# Patient Record
Sex: Female | Born: 1985 | Hispanic: Yes | Marital: Single | Smoking: Former smoker
Health system: Southern US, Community
[De-identification: ages and names within clinical notes are randomized; demographics above are authoritative.]

## PROBLEM LIST (undated history)

## (undated) DIAGNOSIS — F419 Anxiety disorder, unspecified: Secondary | ICD-10-CM

## (undated) DIAGNOSIS — F32A Depression, unspecified: Secondary | ICD-10-CM

## (undated) DIAGNOSIS — O24419 Gestational diabetes mellitus in pregnancy, unspecified control: Secondary | ICD-10-CM

## (undated) HISTORY — PX: NO PAST SURGERIES: SHX2092

## (undated) HISTORY — DX: Gestational diabetes mellitus in pregnancy, unspecified control: O24.419

---

## 2013-10-11 DIAGNOSIS — F32A Depression, unspecified: Secondary | ICD-10-CM | POA: Insufficient documentation

## 2014-06-20 DIAGNOSIS — Z8632 Personal history of gestational diabetes: Secondary | ICD-10-CM | POA: Insufficient documentation

## 2017-02-11 ENCOUNTER — Encounter (HOSPITAL_COMMUNITY): Payer: Self-pay | Admitting: Emergency Medicine

## 2017-02-11 ENCOUNTER — Emergency Department (HOSPITAL_COMMUNITY)
Admission: EM | Admit: 2017-02-11 | Discharge: 2017-02-11 | Disposition: A | Discharge status: Home / Self Care | Payer: Medicaid Other | Attending: Emergency Medicine | Admitting: Emergency Medicine

## 2017-02-11 DIAGNOSIS — F17200 Nicotine dependence, unspecified, uncomplicated: Secondary | ICD-10-CM | POA: Diagnosis not present

## 2017-02-11 DIAGNOSIS — H10023 Other mucopurulent conjunctivitis, bilateral: Secondary | ICD-10-CM | POA: Insufficient documentation

## 2017-02-11 DIAGNOSIS — H109 Unspecified conjunctivitis: Secondary | ICD-10-CM | POA: Diagnosis present

## 2017-02-11 DIAGNOSIS — H1033 Unspecified acute conjunctivitis, bilateral: Secondary | ICD-10-CM

## 2017-02-11 MED ORDER — ERYTHROMYCIN 5 MG/GM OP OINT
1.0000 "application " | TOPICAL_OINTMENT | Freq: Once | OPHTHALMIC | Status: AC
  Administered 2017-02-11: 1 via OPHTHALMIC
  Filled 2017-02-11: qty 3.5

## 2017-02-11 MED ORDER — DIPHENHYDRAMINE HCL 25 MG PO TABS: 25.0000 mg | ORAL_TABLET | Freq: Four times a day (QID) | ORAL | 0 refills | Status: DC

## 2017-02-11 NOTE — ED Notes (Signed)
Pt demonstrated appropriate application of eye ointment.

## 2017-02-11 NOTE — ED Triage Notes (Signed)
C/o L eye redness and burning x 3 days.  States symptoms are now starting in R eye.  Husband here with same symptoms.

## 2017-02-11 NOTE — Discharge Instructions (Signed)
Use the eye ointment 3 times a day in each eye for the next 7 days. Follow up with the eye doctor if symptoms are not improving over the next 24 hours or return here as needed.

## 2017-02-11 NOTE — ED Provider Notes (Signed)
[facility] Provider Note   CSN: [id number] Arrival date & time: 02/11/17  0006     History   Chief Complaint Chief Complaint  Patient presents with  . Conjunctivitis    HPI Alexis Caldwell is a 31 y.o. female who presents to the ED with eye irritation. She reports that her husband had the same thing that started the day before hers. She first noted irritation of the left eye and then it spread to the right. She complains of itching and redness.  HPI  History reviewed. No pertinent past medical history.  There are no active problems to display for this patient.   History reviewed. No pertinent surgical history.  OB History    No data available       Home Medications    Prior to Admission medications   Medication Sig Start Date End Date Taking? Authorizing Provider  diphenhydrAMINE (BENADRYL) 25 MG tablet Take 1 tablet (25 mg total) by mouth every 6 (six) hours. 02/11/17   Janne NapoleonNeese, Hope M, NP    Family History No family history on file.  Social History Social History  Substance Use Topics  . Smoking status: Current Every Day Smoker  . Smokeless tobacco: Never Used  . Alcohol use Yes     Allergies   Patient has no allergy information on record.   Review of Systems Review of Systems  Constitutional: Negative for chills and fever.  HENT: Negative.   Eyes: Positive for photophobia, discharge, redness and itching. Negative for visual disturbance.  Respiratory: Negative for cough.   Cardiovascular: Negative for chest pain.  Gastrointestinal: Negative for nausea and vomiting.  Skin: Negative for rash.  Neurological: Negative for dizziness and headaches.  Hematological: Negative for adenopathy.  Psychiatric/Behavioral: The patient is not nervous/anxious.      Physical Exam Updated Vital Signs BP 121/71 (BP Location: Right Arm)   Pulse 89   Temp 98.4 F (36.9 C) (Oral)   Resp 20   LMP 01/28/2017   SpO2 99%   Physical Exam  Constitutional:  She is oriented to person, place, and time. She appears well-developed and well-nourished. No distress.  HENT:  Head: Normocephalic.  Eyes: EOM are normal. Pupils are equal, round, and reactive to light. Left eye exhibits discharge. Right conjunctiva is injected. Left conjunctiva is injected.  Neck: Neck supple.  Cardiovascular: Normal rate.   Pulmonary/Chest: Effort normal.  Abdominal: Soft. There is no tenderness.  Musculoskeletal: Normal range of motion.  Neurological: She is alert and oriented to person, place, and time. No cranial nerve deficit.  Skin: Skin is warm and dry.  Psychiatric: She has a normal mood and affect. Her behavior is normal.  Nursing note and vitals reviewed.    ED Treatments / Results  Labs (all labs ordered are listed, but only abnormal results are displayed) Labs Reviewed - No data to display   Radiology No results found.  Procedures Procedures (including critical care time)  Medications Ordered in ED Medications  erythromycin ophthalmic ointment 1 application (not administered)     Initial Impression / Assessment and Plan / ED Course  I have reviewed the triage vital signs and the nursing notes.   Final Clinical Impressions(s) / ED Diagnoses  31 y.o. female with bilateral eye irritation that started in the left eye and then the right. Stable for d/c without change in vision. No fever and does not appear toxic. F/u with opthalmology.  Final diagnoses:  Acute bacterial conjunctivitis of both eyes    New  Prescriptions New Prescriptions   DIPHENHYDRAMINE (BENADRYL) 25 MG TABLET    Take 1 tablet (25 mg total) by mouth every 6 (six) hours.     Kerrie Buffalo [place], [place] 02/11/17 1610    Devoria Albe, MD 02/11/17 [phone]

## 2018-12-22 ENCOUNTER — Ambulatory Visit (HOSPITAL_COMMUNITY)
Admission: EM | Admit: 2018-12-22 | Discharge: 2018-12-22 | Disposition: A | Discharge status: Home / Self Care | Payer: Medicaid Other | Attending: Family Medicine | Admitting: Family Medicine

## 2018-12-22 ENCOUNTER — Encounter (HOSPITAL_COMMUNITY): Payer: Self-pay | Admitting: Emergency Medicine

## 2018-12-22 ENCOUNTER — Other Ambulatory Visit: Payer: Self-pay

## 2018-12-22 DIAGNOSIS — Z3202 Encounter for pregnancy test, result negative: Secondary | ICD-10-CM

## 2018-12-22 DIAGNOSIS — N939 Abnormal uterine and vaginal bleeding, unspecified: Secondary | ICD-10-CM | POA: Insufficient documentation

## 2018-12-22 LAB — POCT URINALYSIS DIP (DEVICE)
Bilirubin Urine: NEGATIVE
Glucose, UA: NEGATIVE mg/dL
Ketones, ur: NEGATIVE mg/dL
Nitrite: NEGATIVE
Protein, ur: NEGATIVE mg/dL
Specific Gravity, Urine: 1.025 (ref 1.005–1.030)
Urobilinogen, UA: 0.2 mg/dL (ref 0.0–1.0)
pH: 7 (ref 5.0–8.0)

## 2018-12-22 LAB — POCT PREGNANCY, URINE: Preg Test, Ur: NEGATIVE

## 2018-12-22 LAB — HEMOGLOBIN AND HEMATOCRIT, BLOOD
HCT: 40.3 % (ref 36.0–46.0)
Hemoglobin: 13.9 g/dL (ref 12.0–15.0)

## 2018-12-22 MED ORDER — NAPROXEN 375 MG PO TABS: 375.0000 mg | ORAL_TABLET | Freq: Two times a day (BID) | ORAL | 0 refills | Status: DC

## 2018-12-22 NOTE — ED Provider Notes (Signed)
[facility]    CSN: [id number] Arrival date & time: 12/22/18  1339     History   Chief Complaint Chief Complaint  Patient presents with  . Vaginal Bleeding    HPI Alexis Caldwell is a 33 y.o. female no significant past medical history presenting today for evaluation of vaginal bleeding.  Patient has had intermittent bleeding for the past 2 weeks.  She notes that she began bleeding on 4/22 months restarting her oral contraceptives.  She is on low Loestrin Fe.  Prior to this she had a 2-week lapse in taking her oral contraceptives.  She does admit to being sexually active during this time.  She has had some cramping associated with this, but this is intermittent as well.  States that flow will vary between heavy and light, but then will return to heavy again.  She denies any nausea or vomiting.  She does admit to recently fasting.  She goes to OB/GYN at [facility] choice in [place].  Denies any abnormal vaginal discharge.  Denies any dysuria or increased frequency.  Denies rectal bleeding.  HPI  History reviewed. No pertinent past medical history.  There are no active problems to display for this patient.   History reviewed. No pertinent surgical history.  OB History   No obstetric history on file.      Home Medications    Prior to Admission medications   Medication Sig Start Date End Date Taking? Authorizing Provider  diphenhydrAMINE (BENADRYL) 25 MG tablet Take 1 tablet (25 mg total) by mouth every 6 (six) hours. 02/11/17   Janne Napoleon, NP  naproxen (NAPROSYN) 375 MG tablet Take 1 tablet (375 mg total) by mouth 2 (two) times daily. 12/22/18   Baylee Campus, Junius Creamer, PA-C    Family History Family History  Problem Relation Age of Onset  . Hypertension Mother   . Diabetes Mother   . Diabetes Father     Social History Social History   Tobacco Use  . Smoking status: Current Every Day Smoker  . Smokeless tobacco: Never Used  Substance Use Topics  . Alcohol  use: Yes  . Drug use: No     Allergies   Penicillins   Review of Systems Review of Systems  Constitutional: Negative for fever.  Respiratory: Negative for shortness of breath.   Cardiovascular: Negative for chest pain.  Gastrointestinal: Negative for abdominal pain, diarrhea, nausea and vomiting.  Genitourinary: Positive for menstrual problem and vaginal bleeding. Negative for dysuria, flank pain, genital sores, hematuria, vaginal discharge and vaginal pain.  Musculoskeletal: Negative for back pain.  Skin: Negative for rash.  Neurological: Negative for dizziness, light-headedness and headaches.     Physical Exam Triage Vital Signs ED Triage Vitals  Enc Vitals Group     BP 12/22/18 1407 110/65     Pulse Rate 12/22/18 1407 73     Resp 12/22/18 1407 18     Temp 12/22/18 1407 98.3 F (36.8 C)     Temp Source 12/22/18 1407 Oral     SpO2 12/22/18 1407 100 %     Weight --      Height --      Head Circumference --      Peak Flow --      Pain Score 12/22/18 1403 2     Pain Loc --      Pain Edu? --      Excl. in GC? --    No data found.  Updated Vital Signs BP 110/65 (  BP Location: Left Arm) Comment (BP Location): large cuff  Pulse 73   Temp 98.3 F (36.8 C) (Oral)   Resp 18   SpO2 100%   Visual Acuity Right Eye Distance:   Left Eye Distance:   Bilateral Distance:    Right Eye Near:   Left Eye Near:    Bilateral Near:     Physical Exam Vitals signs and nursing note reviewed.  Constitutional:      General: She is not in acute distress.    Appearance: She is well-developed.     Comments: No acute distress  HENT:     Head: Normocephalic and atraumatic.  Eyes:     Conjunctiva/sclera: Conjunctivae normal.  Neck:     Musculoskeletal: Neck supple.  Cardiovascular:     Rate and Rhythm: Normal rate and regular rhythm.     Heart sounds: No murmur.  Pulmonary:     Effort: Pulmonary effort is normal. No respiratory distress.     Breath sounds: Normal breath  sounds.  Abdominal:     Palpations: Abdomen is soft.     Tenderness: There is no abdominal tenderness.     Comments: Soft, nondistended, nontender to light and deep palpation throughout all 4 quadrants, epigastrium and suprapubic area  Genitourinary:    Comments: Normal external female genitalia, no external source of bleeding visualized, bright red blood seen in vaginal vault, exiting os, no other lesions or sources of bleeding visualized.  No cervical erythema. Skin:    General: Skin is warm and dry.  Neurological:     Mental Status: She is alert.      UC Treatments / Results  Labs (all labs ordered are listed, but only abnormal results are displayed) Labs Reviewed  POCT URINALYSIS DIP (DEVICE) - Abnormal; Notable for the following components:      Result Value   Hgb urine dipstick MODERATE (*)    Leukocytes,Ua TRACE (*)    All other components within normal limits  HEMOGLOBIN AND HEMATOCRIT, BLOOD  POC URINE PREG, ED  POCT PREGNANCY, URINE  CERVICOVAGINAL ANCILLARY ONLY    EKG None  Radiology No results found.  Procedures Procedures (including critical care time)  Medications Ordered in UC Medications - No data to display  Initial Impression / Assessment and Plan / UC Course  I have reviewed the triage vital signs and the nursing notes.  Pertinent labs & imaging results that were available during my care of the patient were reviewed by me and considered in my medical decision making (see chart for details).     Patient with abnormal uterine bleeding, recent lapse in birth control pills as possible cause with altering hormones.  Pregnancy test negative.  Will check hemoglobin.  Will have continue taking oral contraceptive pills, will defer any other hormonal treatment at this time and have patient follow-up with OB/GYN if symptoms persisting or worsening.  Vital signs stable, no tachycardia.  Continue to monitor,Discussed strict return precautions. Patient  verbalized understanding and is agreeable with plan.  Final Clinical Impressions(s) / UC Diagnoses   Final diagnoses:  Abnormal vaginal bleeding     Discharge Instructions     Pregnancy Test negative Please follow up with OBGYN for follow up of bleeding, Please continue taking pills consistently and see if bleeding levels out with being back on pills consistently  We will call with results of swab checking for STD's, Hemoglobin May use naprosyn twice daily for cramping Follow up if symptoms worsening    ED Prescriptions  Medication Sig Dispense Auth. Provider   naproxen (NAPROSYN) 375 MG tablet Take 1 tablet (375 mg total) by mouth 2 (two) times daily. 20 tablet Etherine Mackowiak, WoodbineHallie C, PA-C     Controlled Substance Prescriptions [place] Controlled Substance Registry consulted? Not Applicable   Lew DawesWieters, Isaly Fasching C, [place]PA-C 12/22/18 1448

## 2018-12-22 NOTE — Discharge Instructions (Addendum)
Pregnancy Test negative Please follow up with OBGYN for follow up of bleeding, Please continue taking pills consistently and see if bleeding levels out with being back on pills consistently  We will call with results of swab checking for STD's, Hemoglobin May use naprosyn twice daily for cramping Follow up if symptoms worsening

## 2018-12-22 NOTE — ED Triage Notes (Signed)
14 days of intermittent, heavy bleeding.   Patient ran out of birth control pills and had a 2 weeks gap in taking medication.  Patient has bcp medicine, now. Patient restarted bcp 3-wednesdays ago.

## 2018-12-23 ENCOUNTER — Telehealth (HOSPITAL_COMMUNITY): Payer: Self-pay | Admitting: Emergency Medicine

## 2018-12-23 LAB — CERVICOVAGINAL ANCILLARY ONLY
Bacterial vaginitis: POSITIVE — AB
Candida vaginitis: NEGATIVE
Chlamydia: NEGATIVE
Neisseria Gonorrhea: NEGATIVE
Trichomonas: POSITIVE — AB

## 2018-12-23 MED ORDER — METRONIDAZOLE 500 MG PO TABS: 500.0000 mg | ORAL_TABLET | Freq: Two times a day (BID) | ORAL | 0 refills | Status: AC

## 2018-12-23 NOTE — Telephone Encounter (Signed)
Bacterial vaginosis is positive. This was not treated at the urgent care visit.  Flagyl 500 mg BID x 7 days #14 no refills sent to patients pharmacy of choice.    Trichomonas is positive. Rx  for Flagyl 2 grams, once was sent to the pharmacy of record. Pt needs education to refrain from sexual intercourse for 7 days to give the medicine time to work. Sexual partners need to be notified and tested/treated. Condoms may reduce risk of reinfection. Recheck for further evaluation if symptoms are not improving.  Patient contacted and made aware of all results, all questions answered.   

## 2019-10-03 ENCOUNTER — Encounter (HOSPITAL_COMMUNITY): Payer: Self-pay

## 2019-10-03 ENCOUNTER — Ambulatory Visit (HOSPITAL_COMMUNITY)
Admission: EM | Admit: 2019-10-03 | Discharge: 2019-10-03 | Disposition: A | Discharge status: Home / Self Care | Payer: Medicaid Other | Attending: Physician Assistant | Admitting: Physician Assistant

## 2019-10-03 ENCOUNTER — Other Ambulatory Visit: Payer: Self-pay

## 2019-10-03 DIAGNOSIS — K047 Periapical abscess without sinus: Secondary | ICD-10-CM | POA: Diagnosis not present

## 2019-10-03 DIAGNOSIS — K0889 Other specified disorders of teeth and supporting structures: Secondary | ICD-10-CM

## 2019-10-03 MED ORDER — HYDROCODONE-ACETAMINOPHEN 5-325 MG PO TABS
1.0000 | ORAL_TABLET | ORAL | 0 refills | Status: DC | PRN
Start: 2019-10-03 — End: 2022-07-20

## 2019-10-03 MED ORDER — KETOROLAC TROMETHAMINE 60 MG/2ML IM SOLN
INTRAMUSCULAR | Status: AC
  Filled 2019-10-03: qty 2

## 2019-10-03 MED ORDER — KETOROLAC TROMETHAMINE 60 MG/2ML IM SOLN
60.0000 mg | Freq: Once | INTRAMUSCULAR | Status: AC
  Administered 2019-10-03: 17:00:00 60 mg via INTRAMUSCULAR

## 2019-10-03 MED ORDER — CLINDAMYCIN HCL 150 MG PO CAPS: 150.0000 mg | ORAL_CAPSULE | Freq: Three times a day (TID) | ORAL | 0 refills | Status: AC

## 2019-10-03 NOTE — ED Triage Notes (Signed)
Pt states he has a infected wisdom tooth on her right side of her mouth. Pt state she has pain in neck and right shoulder. Pt states the pain is radiating from her jaw to her neck and shoulder x 1 week. Pt state she has antidotic for the tooth.

## 2019-10-03 NOTE — Discharge Instructions (Signed)
In addition to the clindamycin you are currently taking, take 1 tablet of the newly prescribed with each dose. For a total of 2 tablets every 8 hours  Take 600-800mg  ibuprofen every 8 hours  Take the norco primarily at night to sleep, 1-2 tablets  Follow up with your dentist  If you pain worsens or you develop fever return to clinic

## 2019-10-03 NOTE — ED Provider Notes (Signed)
[place]    CSN: [id number] Arrival date & time: 10/03/19  1526      History   Chief Complaint Chief Complaint  Patient presents with  . Dental Pain    HPI Alexis Caldwell is a 34 y.o. female.   Patient reports to urgent care today for right jaw pain and tooth infection. She reports she has an infection in her Wisdom tooth that she was seen by a dentist about 2 weeks ago for. She was instructed to started Clindamycin at that time however there was a 1 week delay in treatment due to medication being sent to the wrong pharmacy. She began the Clindamycin on Monday 2/15. She reports worsening right sided jaw pain and now with right neck and pain in her trapezius muscle. She reports pain with movement and sometimes with opening her jaw. Pain is severe and as high as 10/10. Over the counter pain relievers have done very little. She denies fever, chills, throat pain or difficulty swallowing. She reports dental follow up in early march. She is hoping for more guidance on pain and to make sure she is ok today. The pain has hindered her sleep significantly and hurts when laying on the right side.     History reviewed. No pertinent past medical history.  There are no problems to display for this patient.   History reviewed. No pertinent surgical history.  OB History   No obstetric history on file.      Home Medications    Prior to Admission medications   Medication Sig Start Date End Date Taking? Authorizing Provider  clindamycin (CLEOCIN) 150 MG capsule Take 1 capsule (150 mg total) by mouth 3 (three) times daily for 7 days. 10/03/19 10/10/19  Larrie Fraizer, Marguerita Beards, PA-C  diphenhydrAMINE (BENADRYL) 25 MG tablet Take 1 tablet (25 mg total) by mouth every 6 (six) hours. 02/11/17   Ashley Murrain, NP  HYDROcodone-acetaminophen (NORCO/VICODIN) 5-325 MG tablet Take 1-2 tablets by mouth every 4 (four) hours as needed for moderate pain or severe pain. 10/03/19   Caelen Reierson, Marguerita Beards, PA-C   naproxen (NAPROSYN) 375 MG tablet Take 1 tablet (375 mg total) by mouth 2 (two) times daily. 12/22/18   Wieters, Elesa Hacker, PA-C    Family History Family History  Problem Relation Age of Onset  . Hypertension Mother   . Diabetes Mother   . Diabetes Father     Social History Social History   Tobacco Use  . Smoking status: Current Every Day Smoker  . Smokeless tobacco: Never Used  Substance Use Topics  . Alcohol use: Yes  . Drug use: No     Allergies   Penicillins   Review of Systems Review of Systems  Constitutional: Negative for chills, fatigue and fever.  HENT: Positive for dental problem. Negative for drooling, ear discharge, ear pain, facial swelling, hearing loss, mouth sores, sinus pressure, sinus pain, sore throat, trouble swallowing and voice change.   Eyes: Negative for pain.  Gastrointestinal: Negative.   Musculoskeletal: Positive for neck pain and neck stiffness. Negative for arthralgias and back pain.  Skin: Negative for color change and wound.  Neurological: Negative for headaches.  Hematological: Negative for adenopathy.     Physical Exam Triage Vital Signs ED Triage Vitals  Enc Vitals Group     BP 10/03/19 1633 135/84     Pulse Rate 10/03/19 1633 76     Resp 10/03/19 1633 18     Temp 10/03/19 1633 98.5 F (36.9 C)  Temp Source 10/03/19 1633 Oral     SpO2 10/03/19 1633 100 %     Weight --      Height --      Head Circumference --      Peak Flow --      Pain Score 10/03/19 1631 8     Pain Loc --      Pain Edu? --      Excl. in GC? --    No data found.  Updated Vital Signs BP 135/84 (BP Location: Right Arm)   Pulse 76   Temp 98.5 F (36.9 C) (Oral)   Resp 18   LMP 09/05/2019   SpO2 100%   Visual Acuity Right Eye Distance:   Left Eye Distance:   Bilateral Distance:    Right Eye Near:   Left Eye Near:    Bilateral Near:     Physical Exam Vitals and nursing note reviewed.  Constitutional:      General: She is not in acute  distress.    Appearance: She is well-developed. She is obese. She is not ill-appearing.  HENT:     Head: Normocephalic and atraumatic.     Mouth/Throat:     Lips: Pink.     Mouth: Mucous membranes are moist. No injury or angioedema.     Dentition: Abnormal dentition. Dental tenderness, gingival swelling (right mandibular mollars) and dental caries present. No dental abscesses.     Tongue: No lesions. Tongue does not deviate from midline.     Palate: No mass and lesions.     Pharynx: Oropharynx is clear. Uvula midline. No pharyngeal swelling or oropharyngeal exudate.     Comments: TTP along right mandible. Full ROM present with some pain. Eyes:     Extraocular Movements: Extraocular movements intact.     Conjunctiva/sclera: Conjunctivae normal.     Pupils: Pupils are equal, round, and reactive to light.  Neck:     Trachea: Trachea normal.  Cardiovascular:     Rate and Rhythm: Normal rate.  Pulmonary:     Effort: Pulmonary effort is normal. No respiratory distress.  Musculoskeletal:     Cervical back: Normal range of motion and neck supple. Pain with movement (along SCM and into right trapezius) and muscular tenderness (along RIght SCM and right trapezius, no swelling or erythema) present.  Lymphadenopathy:     Cervical: No cervical adenopathy.  Skin:    General: Skin is warm and dry.     Findings: No erythema.  Neurological:     General: No focal deficit present.     Mental Status: She is alert and oriented to person, place, and time.  Psychiatric:        Mood and Affect: Mood normal.        Behavior: Behavior normal.        Thought Content: Thought content normal.        Judgment: Judgment normal.      UC Treatments / Results  Labs (all labs ordered are listed, but only abnormal results are displayed) Labs Reviewed - No data to display  EKG   Radiology No results found.  Procedures Procedures (including critical care time)  Medications Ordered in UC Medications   ketorolac (TORADOL) injection 60 mg (60 mg Intramuscular Given 10/03/19 1707)    Initial Impression / Assessment and Plan / UC Course  I have reviewed the triage vital signs and the nursing notes.  Pertinent labs & imaging results that were available during my care of  the patient were reviewed by me and considered in my medical decision making (see chart for details).     #Dental infection  #dental pain Patient is a 34 year old female presenting with known dental infection and pain. Her delay in antibiotic initiation is most certainly why her pain is persistent. She was placed on clindamycin 150mg  TID. No sign of developing deep tissue infection, believe pain in neck and trap to be referred or 2/2 inflammation. Discussed antibiotic management with Dr. the attending on shift today and decision was made to increase clindamycin dose to 300mg  given long delay in treatment.  - Toradol given today in clinic - short course of norco given for night time pain - ibuprofen during the day - instructed to follow up with dental office for possible reevaluation  - ED precautions discussed  Final Clinical Impressions(s) / UC Diagnoses   Final diagnoses:  Pain, dental  Dental infection     Discharge Instructions     In addition to the clindamycin you are currently taking, take 1 tablet of the newly prescribed with each dose. For a total of 2 tablets every 8 hours  Take 600-800mg  ibuprofen every 8 hours  Take the norco primarily at night to sleep, 1-2 tablets  Follow up with your dentist  If you pain worsens or you develop fever return to clinic      ED Prescriptions    Medication Sig Dispense Auth. Provider   clindamycin (CLEOCIN) 150 MG capsule Take 1 capsule (150 mg total) by mouth 3 (three) times daily for 7 days. 21 capsule Saamiya Jeppsen, Delton See, PA-C   HYDROcodone-acetaminophen (NORCO/VICODIN) 5-325 MG tablet Take 1-2 tablets by mouth every 4 (four) hours as needed for moderate pain  or severe pain. 5 tablet Klayten Jolliff, , PA-C     I have reviewed the PDMP during this encounter.   Veryl Speak, PA-C 10/04/19 [phone]

## 2021-07-11 ENCOUNTER — Emergency Department (HOSPITAL_COMMUNITY)
Admission: EM | Admit: 2021-07-11 | Discharge: 2021-07-11 | Payer: Medicaid Other | Attending: Emergency Medicine | Admitting: Emergency Medicine

## 2021-07-11 ENCOUNTER — Emergency Department (HOSPITAL_COMMUNITY): Payer: Medicaid Other

## 2021-07-11 ENCOUNTER — Encounter (HOSPITAL_COMMUNITY): Payer: Self-pay

## 2021-07-11 DIAGNOSIS — F17200 Nicotine dependence, unspecified, uncomplicated: Secondary | ICD-10-CM | POA: Diagnosis not present

## 2021-07-11 DIAGNOSIS — M25522 Pain in left elbow: Secondary | ICD-10-CM | POA: Insufficient documentation

## 2021-07-11 DIAGNOSIS — S0990XA Unspecified injury of head, initial encounter: Secondary | ICD-10-CM | POA: Diagnosis not present

## 2021-07-11 DIAGNOSIS — S199XXA Unspecified injury of neck, initial encounter: Secondary | ICD-10-CM | POA: Diagnosis present

## 2021-07-11 DIAGNOSIS — M25561 Pain in right knee: Secondary | ICD-10-CM | POA: Diagnosis not present

## 2021-07-11 DIAGNOSIS — S80212A Abrasion, left knee, initial encounter: Secondary | ICD-10-CM | POA: Diagnosis not present

## 2021-07-11 DIAGNOSIS — S1091XA Abrasion of unspecified part of neck, initial encounter: Secondary | ICD-10-CM | POA: Insufficient documentation

## 2021-07-11 LAB — I-STAT BETA HCG BLOOD, ED (MC, WL, AP ONLY): I-stat hCG, quantitative: <5 m[IU]/mL (ref <5)

## 2021-07-11 MED ORDER — SODIUM CHLORIDE (PF) 0.9 % IJ SOLN
INTRAMUSCULAR | Status: AC
  Filled 2021-07-11: qty 50

## 2021-07-11 MED ORDER — ACETAMINOPHEN 500 MG PO TABS
1000.0000 mg | ORAL_TABLET | Freq: Once | ORAL | Status: DC
  Filled 2021-07-11: qty 2

## 2021-07-11 MED ORDER — IOHEXOL 350 MG/ML SOLN
80.0000 mL | Freq: Once | INTRAVENOUS | Status: AC | PRN
  Administered 2021-07-11: 80 mL via INTRAVENOUS

## 2021-07-11 MED ORDER — OXYCODONE HCL 5 MG PO TABS
5.0000 mg | ORAL_TABLET | Freq: Once | ORAL | Status: DC
  Filled 2021-07-11: qty 1
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
## 2021-07-11 NOTE — Discharge Instructions (Signed)
Follow up with your doctor in the office.  Return for worsening difficulty breathing or swallowing.
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
## 2022-02-07 ENCOUNTER — Emergency Department (HOSPITAL_BASED_OUTPATIENT_CLINIC_OR_DEPARTMENT_OTHER): Payer: Medicaid Other

## 2022-02-07 ENCOUNTER — Emergency Department (HOSPITAL_BASED_OUTPATIENT_CLINIC_OR_DEPARTMENT_OTHER)
Admission: EM | Admit: 2022-02-07 | Discharge: 2022-02-07 | Disposition: A | Discharge status: Home / Self Care | Payer: Medicaid Other | Attending: Emergency Medicine | Admitting: Emergency Medicine

## 2022-02-07 ENCOUNTER — Encounter (HOSPITAL_BASED_OUTPATIENT_CLINIC_OR_DEPARTMENT_OTHER): Payer: Self-pay | Admitting: Emergency Medicine

## 2022-02-07 ENCOUNTER — Other Ambulatory Visit: Payer: Self-pay

## 2022-02-07 DIAGNOSIS — M5441 Lumbago with sciatica, right side: Secondary | ICD-10-CM | POA: Diagnosis not present

## 2022-02-07 DIAGNOSIS — M5442 Lumbago with sciatica, left side: Secondary | ICD-10-CM | POA: Insufficient documentation

## 2022-02-07 DIAGNOSIS — N939 Abnormal uterine and vaginal bleeding, unspecified: Secondary | ICD-10-CM | POA: Diagnosis not present

## 2022-02-07 DIAGNOSIS — M545 Low back pain, unspecified: Secondary | ICD-10-CM | POA: Diagnosis present

## 2022-02-07 LAB — COMPREHENSIVE METABOLIC PANEL
ALT: 13 U/L (ref 0–44)
AST: 16 U/L (ref 15–41)
Albumin: 4.3 g/dL (ref 3.5–5.0)
Alkaline Phosphatase: 45 U/L (ref 38–126)
Anion gap: 8 (ref 5–15)
BUN: 12 mg/dL (ref 6–20)
CO2: 22 mmol/L (ref 22–32)
Calcium: 9.7 mg/dL (ref 8.9–10.3)
Chloride: 107 mmol/L (ref 98–111)
Creatinine, Ser: 0.74 mg/dL (ref 0.44–1.00)
GFR, Estimated: >60 mL/min (ref >60)
Glucose, Bld: 103 mg/dL — ABNORMAL HIGH (ref 70–99)
Potassium: 3.9 mmol/L (ref 3.5–5.1)
Sodium: 137 mmol/L (ref 135–145)
Total Bilirubin: 0.4 mg/dL (ref 0.3–1.2)
Total Protein: 7.6 g/dL (ref 6.5–8.1)

## 2022-02-07 LAB — CBC WITH DIFFERENTIAL/PLATELET
Abs Immature Granulocytes: 0.02 10*3/uL (ref 0.00–0.07)
Basophils Absolute: 0.1 10*3/uL (ref 0.0–0.1)
Basophils Relative: 1 %
Eosinophils Absolute: 0.2 10*3/uL (ref 0.0–0.5)
Eosinophils Relative: 2 %
HCT: 40.5 % (ref 36.0–46.0)
Hemoglobin: 13.8 g/dL (ref 12.0–15.0)
Immature Granulocytes: 0 %
Lymphocytes Relative: 40 %
Lymphs Abs: 4.3 10*3/uL — ABNORMAL HIGH (ref 0.7–4.0)
MCH: 31.1 pg (ref 26.0–34.0)
MCHC: 34.1 g/dL (ref 30.0–36.0)
MCV: 91.2 fL (ref 80.0–100.0)
Monocytes Absolute: 0.9 10*3/uL (ref 0.1–1.0)
Monocytes Relative: 9 %
Neutro Abs: 5.2 10*3/uL (ref 1.7–7.7)
Neutrophils Relative %: 48 %
Platelets: 216 10*3/uL (ref 150–400)
RBC: 4.44 MIL/uL (ref 3.87–5.11)
RDW: 12.3 % (ref 11.5–15.5)
WBC: 10.7 10*3/uL — ABNORMAL HIGH (ref 4.0–10.5)
nRBC: 0 % (ref 0.0–0.2)

## 2022-02-07 LAB — URINALYSIS, ROUTINE W REFLEX MICROSCOPIC
Bilirubin Urine: NEGATIVE
Glucose, UA: NEGATIVE mg/dL
Leukocytes,Ua: NEGATIVE
Nitrite: NEGATIVE
Protein, ur: 30 mg/dL — AB
Specific Gravity, Urine: 1.032 — ABNORMAL HIGH (ref 1.005–1.030)
pH: 6 (ref 5.0–8.0)

## 2022-02-07 LAB — PREGNANCY, URINE: Preg Test, Ur: NEGATIVE

## 2022-02-07 MED ORDER — KETOROLAC TROMETHAMINE 30 MG/ML IJ SOLN
30.0000 mg | Freq: Once | INTRAMUSCULAR | Status: AC
  Administered 2022-02-07: 30 mg via INTRAVENOUS
  Filled 2022-02-07: qty 1

## 2022-02-07 MED ORDER — METHOCARBAMOL 500 MG PO TABS: 1000.0000 mg | ORAL_TABLET | Freq: Four times a day (QID) | ORAL | 0 refills | Status: DC | PRN

## 2022-02-07 NOTE — ED Notes (Signed)
Patient transported to Ultrasound 
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
## 2022-03-30 ENCOUNTER — Emergency Department (HOSPITAL_COMMUNITY): Payer: Medicaid Other

## 2022-03-30 ENCOUNTER — Other Ambulatory Visit: Payer: Self-pay

## 2022-03-30 ENCOUNTER — Emergency Department (HOSPITAL_COMMUNITY)
Admission: EM | Admit: 2022-03-30 | Discharge: 2022-03-30 | Disposition: A | Discharge status: Home / Self Care | Payer: Medicaid Other | Attending: Emergency Medicine | Admitting: Emergency Medicine

## 2022-03-30 ENCOUNTER — Encounter (HOSPITAL_COMMUNITY): Payer: Self-pay | Admitting: Emergency Medicine

## 2022-03-30 DIAGNOSIS — F17210 Nicotine dependence, cigarettes, uncomplicated: Secondary | ICD-10-CM | POA: Diagnosis not present

## 2022-03-30 DIAGNOSIS — M545 Low back pain, unspecified: Secondary | ICD-10-CM | POA: Insufficient documentation

## 2022-03-30 DIAGNOSIS — M7918 Myalgia, other site: Secondary | ICD-10-CM

## 2022-03-30 DIAGNOSIS — Y92512 Supermarket, store or market as the place of occurrence of the external cause: Secondary | ICD-10-CM | POA: Diagnosis not present

## 2022-03-30 DIAGNOSIS — W010XXA Fall on same level from slipping, tripping and stumbling without subsequent striking against object, initial encounter: Secondary | ICD-10-CM | POA: Insufficient documentation

## 2022-03-30 DIAGNOSIS — S50312A Abrasion of left elbow, initial encounter: Secondary | ICD-10-CM | POA: Insufficient documentation

## 2022-03-30 DIAGNOSIS — S59902A Unspecified injury of left elbow, initial encounter: Secondary | ICD-10-CM | POA: Diagnosis present

## 2022-03-30 DIAGNOSIS — W19XXXA Unspecified fall, initial encounter: Secondary | ICD-10-CM

## 2022-03-30 LAB — PREGNANCY, URINE: Preg Test, Ur: NEGATIVE

## 2022-03-30 MED ORDER — LIDOCAINE 5 % EX PTCH
1.0000 | MEDICATED_PATCH | CUTANEOUS | Status: DC
  Administered 2022-03-30: 1 via TRANSDERMAL
  Filled 2022-03-30: qty 1

## 2022-03-30 MED ORDER — IBUPROFEN 600 MG PO TABS: 600.0000 mg | ORAL_TABLET | Freq: Four times a day (QID) | ORAL | 0 refills | Status: DC | PRN

## 2022-03-30 MED ORDER — ACETAMINOPHEN 325 MG PO TABS: 650.0000 mg | ORAL_TABLET | Freq: Four times a day (QID) | ORAL | 0 refills | Status: DC | PRN

## 2022-03-30 MED ORDER — ACETAMINOPHEN 325 MG PO TABS
650.0000 mg | ORAL_TABLET | Freq: Once | ORAL | Status: DC
  Filled 2022-03-30: qty 2

## 2022-03-30 NOTE — ED Provider Notes (Signed)
[facility]-EMERGENCY DEPT Provider Note   CSN: [id number] Arrival date & time: 03/30/22  0120     History  Chief Complaint  Patient presents with   Alexis Caldwell is a 36 y.o. female.  Patient as above with significant medical history as below, including low back pain who presents to the ED with complaint of fall.  Patient reports that she was at [facility] and slipped on a substance on the floor.  She landed on her knees, elbows.  Also apparently hurt her back as well when she fell.  No numbness or tingling that seems new since the fall.  She had some nausea briefly after the fall that she attributes to "shock" but this has since subsided.  No vomiting.  No head injury, no chest pain or dyspnea, no LOC.  No thinners.  No medications prior to arrival.  She was ambulatory after the event.   Reports she is UTD on tetanus  History reviewed. No pertinent past medical history.  History reviewed. No pertinent surgical history.   The history is provided by the patient. No language interpreter was used.       Home Medications Prior to Admission medications   Medication Sig Start Date End Date Taking? Authorizing Provider  acetaminophen (TYLENOL) 325 MG tablet Take 2 tablets (650 mg total) by mouth every 6 (six) hours as needed. 03/30/22  Yes Tanda Rockers A, DO  ibuprofen (ADVIL) 600 MG tablet Take 1 tablet (600 mg total) by mouth every 6 (six) hours as needed. 03/30/22  Yes Tanda Rockers A, DO  diphenhydrAMINE (BENADRYL) 25 MG tablet Take 1 tablet (25 mg total) by mouth every 6 (six) hours. Patient not taking: Reported on 03/30/2022 02/11/17   Janne Napoleon, NP  HYDROcodone-acetaminophen (NORCO/VICODIN) 5-325 MG tablet Take 1-2 tablets by mouth every 4 (four) hours as needed for moderate pain or severe pain. Patient not taking: Reported on 03/30/2022 10/03/19   Darr, Gerilyn Pilgrim, PA-C  methocarbamol (ROBAXIN) 500 MG tablet Take 2 tablets (1,000 mg total) by mouth every 6  (six) hours as needed for muscle spasms. Patient not taking: Reported on 03/30/2022 02/07/22   Long, Arlyss Repress, MD  naproxen (NAPROSYN) 375 MG tablet Take 1 tablet (375 mg total) by mouth 2 (two) times daily. Patient not taking: Reported on 03/30/2022 12/22/18   Wieters, Fran Lowes C, PA-C      Allergies    Penicillins    Review of Systems   Review of Systems  Constitutional:  Negative for activity change and fever.  HENT:  Negative for facial swelling and trouble swallowing.   Eyes:  Negative for discharge and redness.  Respiratory:  Negative for cough and shortness of breath.   Cardiovascular:  Negative for chest pain and palpitations.  Gastrointestinal:  Negative for abdominal pain and nausea.  Genitourinary:  Negative for dysuria and flank pain.  Musculoskeletal:  Positive for arthralgias and back pain. Negative for gait problem.  Skin:  Positive for wound. Negative for pallor and rash.  Neurological:  Negative for syncope and headaches.    Physical Exam Updated Vital Signs BP 112/78   Pulse 73   Temp 98.1 F (36.7 C) (Oral)   Resp 16   Ht 5\' 2"  (1.575 m)   Wt 89.4 kg   LMP 03/06/2022 (Approximate)   SpO2 100%   BMI 36.03 kg/m  Physical Exam Vitals and nursing note reviewed.  Constitutional:      General: She is not in acute  distress.    Appearance: Normal appearance. She is obese. She is not ill-appearing.  HENT:     Head: Normocephalic and atraumatic. No raccoon eyes, Battle's sign, right periorbital erythema or left periorbital erythema.     Jaw: There is normal jaw occlusion. No trismus.     Comments: No external evidence of head trauma    Right Ear: External ear normal.     Left Ear: External ear normal.     Nose: Nose normal.     Mouth/Throat:     Mouth: Mucous membranes are moist.  Eyes:     General: No scleral icterus.       Right eye: No discharge.        Left eye: No discharge.     Extraocular Movements: Extraocular movements intact.     Pupils: Pupils are  equal, round, and reactive to light.  Cardiovascular:     Rate and Rhythm: Normal rate and regular rhythm.     Pulses: Normal pulses.     Heart sounds: Normal heart sounds.  Pulmonary:     Effort: Pulmonary effort is normal. No respiratory distress.     Breath sounds: Normal breath sounds.  Abdominal:     General: Abdomen is flat.     Palpations: Abdomen is soft.     Tenderness: There is no abdominal tenderness.  Musculoskeletal:        General: Normal range of motion.     Cervical back: Full passive range of motion without pain and normal range of motion.       Back:     Right lower leg: No edema.     Left lower leg: No edema.     Comments: Upper extremities neurovascular intact to radial, ulnar median nerve distributions.  2+ radial pulses equal bilateral.  Sensation intact lower extremities equal bilateral. Strength 5/5 bilateral upper and lower extremities. DP pulses equal bilateral No pain to either knee with provocative testing.,  No pain with varus and valgus.  Achilles tendon intact b/l.  Quad tendon intact b/l   No midline spinous process tenderness to palpation or percussion, no crepitus or step-off.    Pelvis stable to AP pressure    Skin:    General: Skin is warm and dry.     Capillary Refill: Capillary refill takes less than 2 seconds.  Neurological:     Mental Status: She is alert and oriented to person, place, and time.     GCS: GCS eye subscore is 4. GCS verbal subscore is 5. GCS motor subscore is 6.     Cranial Nerves: Cranial nerves 2-12 are intact. No dysarthria.     Sensory: Sensation is intact.     Motor: Motor function is intact.     Coordination: Coordination is intact. Coordination normal.  Psychiatric:        Mood and Affect: Mood normal.        Behavior: Behavior normal.     ED Results / Procedures / Treatments   Labs (all labs ordered are listed, but only abnormal results are displayed) Labs Reviewed  PREGNANCY, URINE  POC URINE PREG, ED     EKG None  Radiology DG Elbow Complete Left  Result Date: 03/30/2022 CLINICAL DATA:  36 year old female status post fall. Pain. EXAM: LEFT ELBOW - COMPLETE 3+ VIEW COMPARISON:  Left elbow series 07/11/2021. FINDINGS: Bone mineralization is within normal limits. There is no evidence of fracture, dislocation, or joint effusion. There is no evidence of arthropathy or  other focal bone abnormality. No discrete soft tissue injury. IMPRESSION: Negative. Electronically Signed   By: Odessa Fleming M.D.   On: 03/30/2022 05:05   DG Lumbar Spine Complete  Result Date: 03/30/2022 CLINICAL DATA:  36 year old female status post fall. Pain. EXAM: LUMBAR SPINE - COMPLETE 4+ VIEW COMPARISON:  None Available. FINDINGS: Transitional anatomy. Hypoplastic ribs designated at L1 assuming the lowest lumbarized vertebra is L5. Correlation with radiographs is recommended prior to any operative intervention. Preserved lower thoracic and lumbar vertebral height. Relatively preserved lordosis. Subtle levoconvex lumbar scoliosis. Moderate disc space loss at L5-S1. Mild disc space loss at T12-L1 and L1-L2. Associated endplate spurring at those levels. No pars fracture. Visible sacrum and SI joints appear intact. No acute osseous abnormality identified. Large lamellated up to 4 cm calcification in the right upper quadrant suspicious for a large gallstone. Negative visible bowel gas, other abdominal and pelvic visceral contours. IMPRESSION: 1. No acute osseous abnormality identified in the lumbar spine. 2. Transitional anatomy. Disc and endplate degeneration T12-L1, L1-L2, L5-S1. 3. Large calcified gallstone suspected in the right upper quadrant. Electronically Signed   By: Odessa Fleming M.D.   On: 03/30/2022 05:03   DG Shoulder Left  Result Date: 03/30/2022 CLINICAL DATA:  36 year old female status post fall.  Pain. EXAM: LEFT SHOULDER - 2+ VIEW COMPARISON:  None Available. FINDINGS: Bone mineralization is within normal limits. There is no  evidence of fracture or dislocation. There is no evidence of arthropathy or other focal bone abnormality. Negative visible left chest and ribs. IMPRESSION: Negative. Electronically Signed   By: Odessa Fleming M.D.   On: 03/30/2022 05:00    Procedures Procedures    Medications Ordered in ED Medications  acetaminophen (TYLENOL) tablet 650 mg (650 mg Oral Not Given 03/30/22 0244)  lidocaine (LIDODERM) 5 % 1 patch (1 patch Transdermal Patch Applied 03/30/22 0244)    ED Course/ Medical Decision Making/ A&P                           Medical Decision Making Amount and/or Complexity of Data Reviewed Labs: ordered. Radiology: ordered.  Risk OTC drugs. Prescription drug management.   This patient presents to the ED with chief complaint(s) of fall with pertinent past medical history of low back pain which further complicates the presenting complaint. The complaint involves an extensive differential diagnosis and also carries with it a high risk of complications and morbidity.    The differential diagnosis includes but not limited to sprain, strain, MSK, fracture, soft tissue injury, other acute etiologies were considered. Serious etiologies were considered.   The initial plan is to extremities, Tylenol, lidocaine patch, POC preg   Additional history obtained: Additional history obtained from  n/a Records reviewed Primary Care Documents and prior ED visits, prior imaging  Independent labs interpretation:  The following labs were independently interpreted: Pregnancy test was negative  Independent visualization of imaging: - I independently visualized the following imaging with scope of interpretation limited to determining acute life threatening conditions related to emergency care: Elbow left x-ray, lumbar spine x-ray, shoulder left x-ray, which revealed possible gallstone on x-ray, otherwise no acute fractures or abnormalities  Cardiac monitoring was reviewed and interpreted by myself which  shows n/a  Treatment and Reassessment: Lidocaine patch, Tylenol  Consultation: - Consulted or discussed management/test interpretation w/ external professional: Not applicable  Consideration for admission or further workup: Admission was considered   Symptoms are greatly improved.  No evidence of acute  musculoskeletal injury on imaging.  She is ambulatory with steady gait.  Neurologically intact.    Possible gallstone on x-ray, she has no right upper quadrant pain.  No ongoing nausea or vomiting.  Advise she follow-up with PCP regarding this if symptoms develop  She suffered what appears to be a relatively minor ground-level fall.  No head injury.   Recommend supportive care, pcp f/u  The patient improved significantly and was discharged in stable condition. Detailed discussions were had with the patient regarding current findings, and need for close f/u with PCP or on call doctor. The patient has been instructed to return immediately if the symptoms worsen in any way for re-evaluation. Patient verbalized understanding and is in agreement with current care plan. All questions answered prior to discharge.              Social Determinants of health: Counseled patient for approximately 3 minutes regarding smoking cessation. Discussed risks of smoking and how they applied and affected their visit here today. Patient not ready to quit at this time, however will follow up with their primary doctor when they are.   CPT code: 57846: intermediate counseling for smoking cessation   Social History   Tobacco Use   Smoking status: Every Day    Types: Cigarettes   Smokeless tobacco: Never  Substance Use Topics   Alcohol use: Yes   Drug use: No            Final Clinical Impression(s) / ED Diagnoses Final diagnoses:  Musculoskeletal pain  Fall, initial encounter    Rx / DC Orders ED Discharge Orders          Ordered    ibuprofen (ADVIL) 600 MG tablet  Every 6 hours  PRN        03/30/22 0518    acetaminophen (TYLENOL) 325 MG tablet  Every 6 hours PRN        03/30/22 0518              Sloan Leiter, DO 03/30/22 [phone]

## 2022-03-30 NOTE — ED Triage Notes (Signed)
Patient slipped on some gel on the floor at [employer]. She said she fell and hurt both of her elbows, knees, back and head. Did not lose of consciousness.

## 2022-03-30 NOTE — Discharge Instructions (Addendum)
It was a pleasure caring for you today in the emergency department.  Please return to the emergency department for any worsening or worrisome symptoms.  Please follow up with your PCP  There was a possible gallstone on your XR, please follow up with your PCP if you start having discomfort to your right upper area of your abdomen

## 2022-04-23 DIAGNOSIS — M5417 Radiculopathy, lumbosacral region: Secondary | ICD-10-CM | POA: Insufficient documentation

## 2022-04-23 DIAGNOSIS — M5416 Radiculopathy, lumbar region: Secondary | ICD-10-CM | POA: Insufficient documentation

## 2022-06-15 LAB — OB RESULTS CONSOLE PLATELET COUNT: Platelets: 282

## 2022-06-15 LAB — OB RESULTS CONSOLE HGB/HCT, BLOOD
HCT: 36 (ref 29–41)
Hemoglobin: 12.1

## 2022-06-15 LAB — OB RESULTS CONSOLE ANTIBODY SCREEN: Antibody Screen: NEGATIVE

## 2022-06-15 LAB — OB RESULTS CONSOLE ABO/RH: RH Type: POSITIVE

## 2022-06-15 LAB — OB RESULTS CONSOLE RUBELLA ANTIBODY, IGM: Rubella: IMMUNE

## 2022-06-15 LAB — OB RESULTS CONSOLE RPR: RPR: NONREACTIVE

## 2022-06-15 LAB — OB RESULTS CONSOLE HEPATITIS B SURFACE ANTIGEN: Hepatitis B Surface Ag: NEGATIVE

## 2022-06-15 LAB — HEPATITIS C ANTIBODY: HCV Ab: NEGATIVE

## 2022-06-16 DIAGNOSIS — R0989 Other specified symptoms and signs involving the circulatory and respiratory systems: Secondary | ICD-10-CM | POA: Insufficient documentation

## 2022-06-22 ENCOUNTER — Other Ambulatory Visit: Payer: Self-pay | Admitting: *Deleted

## 2022-06-22 DIAGNOSIS — M79604 Pain in right leg: Secondary | ICD-10-CM

## 2022-06-23 ENCOUNTER — Ambulatory Visit (HOSPITAL_COMMUNITY)
Admission: RE | Admit: 2022-06-23 | Discharge: 2022-06-23 | Disposition: A | Discharge status: Home / Self Care | Payer: Medicaid Other | Source: Ambulatory Visit | Attending: Vascular Surgery | Admitting: Vascular Surgery

## 2022-06-23 ENCOUNTER — Ambulatory Visit (INDEPENDENT_AMBULATORY_CARE_PROVIDER_SITE_OTHER): Payer: Medicaid Other | Admitting: Vascular Surgery

## 2022-06-23 ENCOUNTER — Encounter: Payer: Self-pay | Admitting: Vascular Surgery

## 2022-06-23 VITALS — BP 93/48 | Temp 98.2°F | Resp 18 | Ht 62.0 in | Wt 199.3 lb

## 2022-06-23 DIAGNOSIS — M79604 Pain in right leg: Secondary | ICD-10-CM | POA: Insufficient documentation

## 2022-06-23 DIAGNOSIS — R202 Paresthesia of skin: Secondary | ICD-10-CM | POA: Diagnosis not present

## 2022-06-23 DIAGNOSIS — M79605 Pain in left leg: Secondary | ICD-10-CM | POA: Diagnosis present

## 2022-06-23 NOTE — Progress Notes (Signed)
[facility] [place]  ASSESSMENT / PLAN: 36 y.o. female with numbness and paresthesias of bilateral lower extremities.  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  Suspect her symptoms are from spinal stenosis.  She has a reassuring clinical exam and noninvasive testing today.  Can follow-up with me on an as-needed basis  CHIEF COMPLAINT: Bilateral leg numbness and tingling discomfort  HISTORY OF PRESENT ILLNESS: Alexis Caldwell is a 36 y.o. female referred to clinic for evaluation of possible peripheral arterial disease by her orthopedist, Dr. Gladstone Lighter.  He was unable to palpate pedal pulses in his office.  The patient reports numbness and tingling discomfort in her legs which occurred after sustaining a fall during a girls trip several months ago.  She does not report pain, per se.  She has no symptoms typical of intermittent claudication, ischemic rest pain.  She has no ulcers about her feet.  She also describes bilateral hand numbness for several years.  History reviewed. No pertinent past medical history.  History reviewed. No pertinent surgical history.  Family History  Problem Relation Age of Onset   Hypertension Mother    Diabetes Mother    Diabetes Father     Social History   Socioeconomic History   Marital status: Single    Spouse name: Not on file   Number of children: Not on file   Years of education: Not on file   Highest education level: Not on file  Occupational History   Not on file  Tobacco Use   Smoking status: Former    Types: Cigarettes    Quit date: 04/15/2022    Years since quitting: 0.1   Smokeless tobacco: Never  Substance and Sexual Activity   Alcohol use: Yes   Drug use: No   Sexual activity: Not on file  Other Topics Concern   Not on file  Social History Narrative   Not on file   Social Determinants of Health   Financial Resource Strain: Not on file  Food Insecurity: Not  on file  Transportation Needs: Not on file  Physical Activity: Not on file  Stress: Not on file  Social Connections: Not on file  Intimate Partner Violence: Not on file    Allergies  Allergen Reactions   Penicillins     Current Outpatient Medications  Medication Sig Dispense Refill   acetaminophen (TYLENOL) 325 MG tablet Take 2 tablets (650 mg total) by mouth every 6 (six) hours as needed. (Patient not taking: Reported on 06/23/2022) 36 tablet 0   diphenhydrAMINE (BENADRYL) 25 MG tablet Take 1 tablet (25 mg total) by mouth every 6 (six) hours. (Patient not taking: Reported on 03/30/2022) 20 tablet 0   HYDROcodone-acetaminophen (NORCO/VICODIN) 5-325 MG tablet Take 1-2 tablets by mouth every 4 (four) hours as needed for moderate pain or severe pain. (Patient not taking: Reported on 03/30/2022) 5 tablet 0   ibuprofen (ADVIL) 600 MG tablet Take 1 tablet (600 mg total) by mouth every 6 (six) hours as needed. (Patient not taking: Reported on 06/23/2022) 30 tablet 0   methocarbamol (ROBAXIN) 500 MG tablet Take 2 tablets (1,000 mg total) by mouth every 6 (six) hours as needed for muscle spasms. (Patient not taking: Reported on 03/30/2022) 20 tablet 0   naproxen (NAPROSYN) 375 MG tablet Take 1 tablet (375 mg total) by mouth 2 (two) times daily. (Patient not taking: Reported on 03/30/2022) 20 tablet 0   No current facility-administered medications for  this visit.    PHYSICAL EXAM Vitals:   06/23/22 1351  BP: (!) 93/48  Resp: 18  Temp: 98.2 F (36.8 C)  TempSrc: Temporal  SpO2: 100%  Weight: 199 lb 4.8 oz (90.4 kg)  Height: 5\' 2"  (1.575 m)   Well-appearing young woman in no acute distress Regular rate and rhythm Unlabored breathing Palpable posterior tibial pulses bilaterally Triphasic Doppler flow in all pedal arteries. Easily palpable radial pulses bilaterally   PERTINENT LABORATORY AND RADIOLOGIC DATA  Most recent CBC    Latest Ref Rng & Units 02/07/2022    3:38 PM 12/22/2018     2:36 PM  CBC  WBC 4.0 - 10.5 K/uL 10.7    Hemoglobin 12.0 - 15.0 g/dL 02/21/2019  01.0   Hematocrit 36.0 - 46.0 % 40.5  40.3   Platelets 150 - 400 K/uL 216       Most recent CMP    Latest Ref Rng & Units 02/07/2022    3:38 PM  CMP  Glucose 70 - 99 mg/dL 02/09/2022   BUN 6 - 20 mg/dL 12   Creatinine 355 - 1.00 mg/dL 7.32   Sodium 2.02 - 542 mmol/L 137   Potassium 3.5 - 5.1 mmol/L 3.9   Chloride 98 - 111 mmol/L 107   CO2 22 - 32 mmol/L 22   Calcium 8.9 - 10.3 mg/dL 9.7   Total Protein 6.5 - 8.1 g/dL 7.6   Total Bilirubin 0.3 - 1.2 mg/dL 0.4   Alkaline Phos 38 - 126 U/L 45   AST 15 - 41 U/L 16   ALT 0 - 44 U/L 13     +-------+-----------+-----------+------------+------------+  ABI/TBIToday's ABIToday's TBIPrevious ABIPrevious TBI  +-------+-----------+-----------+------------+------------+  Right 1.03       0.71                                 +-------+-----------+-----------+------------+------------+  Left  1.08       0.64                                 +-------+-----------+-----------+------------+------------+    706. Rande Brunt, MD [facility] Phone Number: [phone] 06/23/2022 5:07 PM   Total time spent on preparing this encounter including chart review, data review, collecting history, examining the patient, coordinating care for this new patient, 45 minutes.  Portions of this report may have been transcribed using voice recognition software.  Every effort has been made to ensure accuracy; however, inadvertent computerized transcription errors may still be present.

## 2022-07-07 ENCOUNTER — Other Ambulatory Visit: Payer: Self-pay

## 2022-07-20 ENCOUNTER — Inpatient Hospital Stay (HOSPITAL_COMMUNITY)
Admission: AD | Admit: 2022-07-20 | Discharge: 2022-07-20 | Disposition: A | Discharge status: Home / Self Care | Payer: Medicaid Other | Attending: Obstetrics and Gynecology | Admitting: Obstetrics and Gynecology

## 2022-07-20 ENCOUNTER — Encounter (HOSPITAL_COMMUNITY): Payer: Self-pay

## 2022-07-20 DIAGNOSIS — Z3492 Encounter for supervision of normal pregnancy, unspecified, second trimester: Secondary | ICD-10-CM | POA: Diagnosis not present

## 2022-07-20 DIAGNOSIS — Z3A15 15 weeks gestation of pregnancy: Secondary | ICD-10-CM | POA: Diagnosis not present

## 2022-07-20 HISTORY — DX: Anxiety disorder, unspecified: F41.9

## 2022-07-20 HISTORY — DX: Depression, unspecified: F32.A

## 2022-07-20 NOTE — MAU Note (Signed)
.  Alexis Caldwell is a 36 y.o. at [redacted]w[redacted]d here in MAU reporting: sent over from the office because they were unable to hear baby's HR with the doppler and then attempted with a bedside ultrasound and thought they saw a flicker but unsure. Denies VB or pain.   Pain score: 0 Vitals:   07/20/22 1542  BP: (!) 104/50  Pulse: (!) 103  Resp: 14  Temp: 98.7 F (37.1 C)  SpO2: 99%     FHT:155

## 2022-07-20 NOTE — MAU Provider Note (Signed)
History     [id number]  Arrival date and time: 07/20/22 1530    Chief Complaint  Patient presents with   Pregnancy Ultrasound     HPI Alexis Caldwell is a 36 y.o. at [redacted]w[redacted]d who presents from the office to check for fetal heart tones. Was at routine OB visit today & states they were unable to doppler heart tones and couldn't see heartbeat on ultrasound. She was sent here for further evaluation. She denies abdominal pain, vaginal bleeding, or LOF.   OB History     Gravida  5   Para  2   Term  2   Preterm      AB  2   Living  2      SAB  1   IAB  1   Ectopic      Multiple      Live Births  2           Past Medical History:  Diagnosis Date   Anxiety    Depression     Past Surgical History:  Procedure Laterality Date   NO PAST SURGERIES      Family History  Problem Relation Age of Onset   Hypertension Mother    Diabetes Mother    Diabetes Father     Allergies  Allergen Reactions   Penicillins     No current facility-administered medications on file prior to encounter.   Current Outpatient Medications on File Prior to Encounter  Medication Sig Dispense Refill   Prenatal Vit-Fe Fumarate-FA (PRENATAL MULTIVITAMIN) TABS tablet Take 1 tablet by mouth daily at 12 noon.     acetaminophen (TYLENOL) 325 MG tablet Take 2 tablets (650 mg total) by mouth every 6 (six) hours as needed. (Patient not taking: Reported on 06/23/2022) 36 tablet 0     ROS Pertinent positives and negative per HPI, all others reviewed and negative  Physical Exam   BP (!) 104/50 (BP Location: Left Arm)   Pulse (!) 103   Temp 98.7 F (37.1 C) (Oral)   Resp 14   Ht 5\' 2"  (1.575 m)   Wt 95.3 kg   LMP 03/06/2022 (Approximate)   SpO2 99%   BMI 38.41 kg/m   Patient Vitals for the past 24 hrs:  BP Temp Temp src Pulse Resp SpO2 Height Weight  07/20/22 1542 (!) 104/50 98.7 F (37.1 C) Oral (!) 103 14 99 % -- --  07/20/22 1538 -- -- -- -- -- -- 5\' 2"  (1.575 m) 95.3 kg     Physical Exam Vitals and nursing note reviewed.  Constitutional:      General: She is not in acute distress.    Appearance: Normal appearance.  HENT:     Head: Normocephalic and atraumatic.  Pulmonary:     Effort: Pulmonary effort is normal. No respiratory distress.  Abdominal:     Palpations: Abdomen is soft.     Tenderness: There is no abdominal tenderness.  Skin:    General: Skin is warm and dry.  Neurological:     Mental Status: She is alert.      Bedside Ultrasound Pt informed that the ultrasound is considered a limited OB ultrasound and is not intended to be a complete ultrasound exam.  Patient also informed that the ultrasound is not being completed with the intent of assessing for fetal or placental anomalies or any pelvic abnormalities.  Explained that the purpose of today's ultrasound is to assess for  viability.  Patient acknowledges the purpose  of the exam and the limitations of the study.     My interpretation: Active fetus, FHR 158 bpm.    Labs No results found for this or any previous visit (from the past 24 hour(s)).  Imaging No results found.  MAU Course  Procedures Lab Orders  No laboratory test(s) ordered today   No orders of the defined types were placed in this encounter.  Imaging Orders  No imaging studies ordered today    MDM FHT present via doppler & bedside ultrasound. Patient reassured Assessment and Plan   1. Fetal heart tones present, second trimester   2. [redacted] weeks gestation of pregnancy    -Reviewed reasons to return to MAU -Keep f/u with OB  Judeth Horn, NP 07/20/22 9:06 PM

## 2022-07-26 IMAGING — CT CT ANGIO NECK
2 of 7 series · 8 of 33 positions shown · IV contrast (omnipaque)
Comparison: None.

CLINICAL DATA: Neck trauma, arterial injury suspected

EXAM:
CT ANGIOGRAPHY NECK
TECHNIQUE: Multidetector CT imaging of the neck was performed using the
standard protocol during bolus administration of intravenous
contrast. Multiplanar CT image reconstructions and MIPs were
obtained to evaluate the vascular anatomy. Carotid stenosis
measurements (when applicable) are obtained utilizing NASCET
criteria, using the distal internal carotid diameter as the
denominator.
CONTRAST:  80mL OMNIPAQUE IOHEXOL 350 MG/ML SOLN

[Series 6: cta neck · axial · 0.39mm/px · z∈[-286,-212]mm · 2 of 111 slices shown]
[im 37/111  soft-tissue]
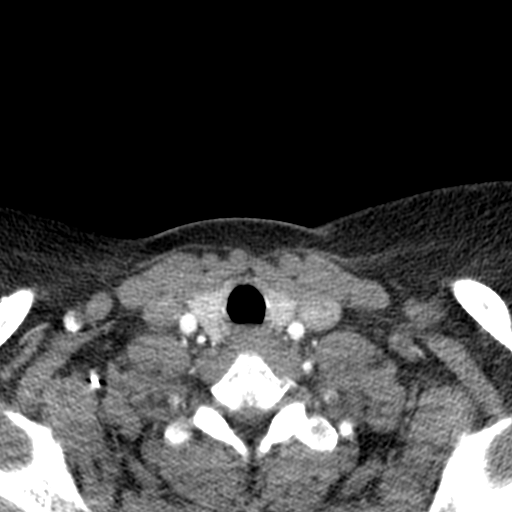
[im 74/111  bone]
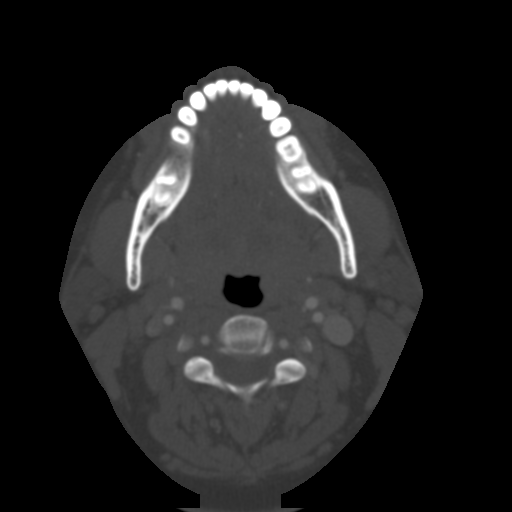

[Series 8: ax thin · axial · 0.39mm/px · z∈[-326,-170]mm · 6 of 220 slices shown]
[im 32/220  soft-tissue]
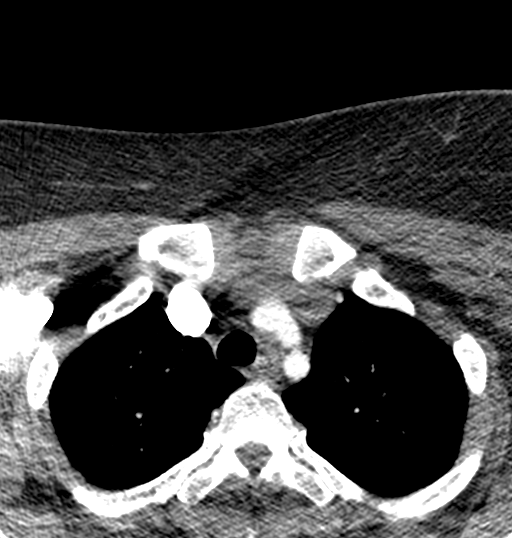
[im 63/220  soft-tissue]
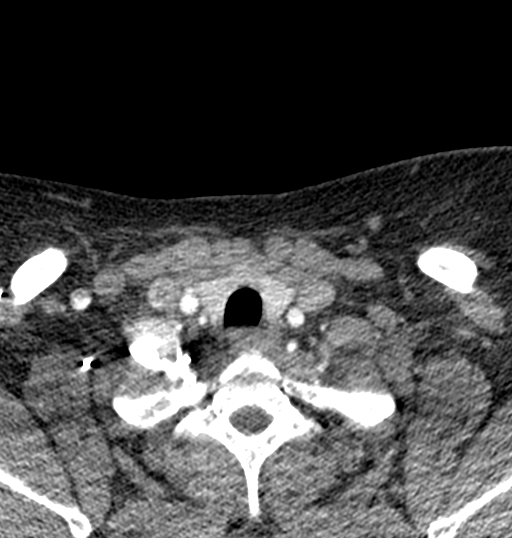
[im 94/220  soft-tissue]
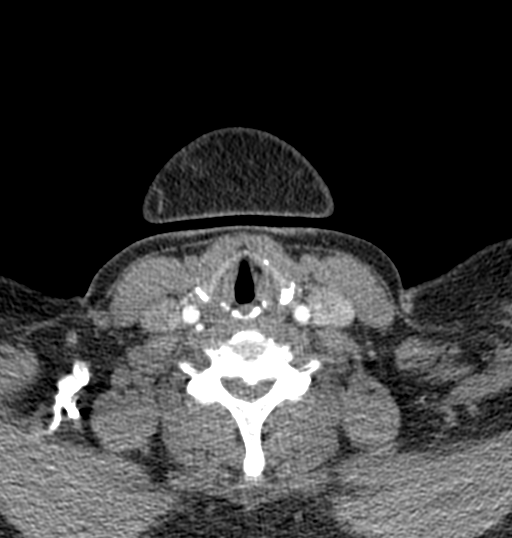
[im 126/220  soft-tissue]
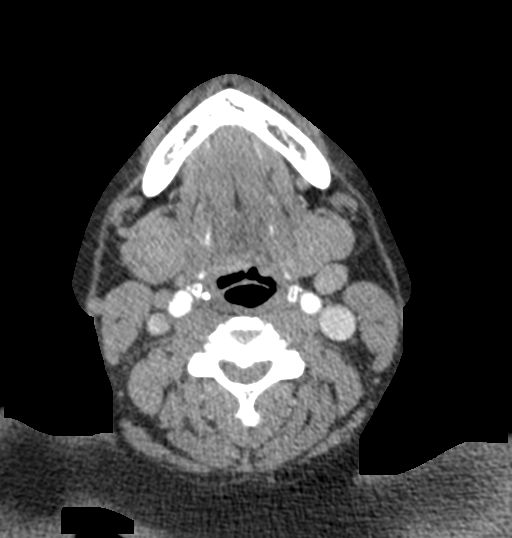
[im 157/220  soft-tissue]
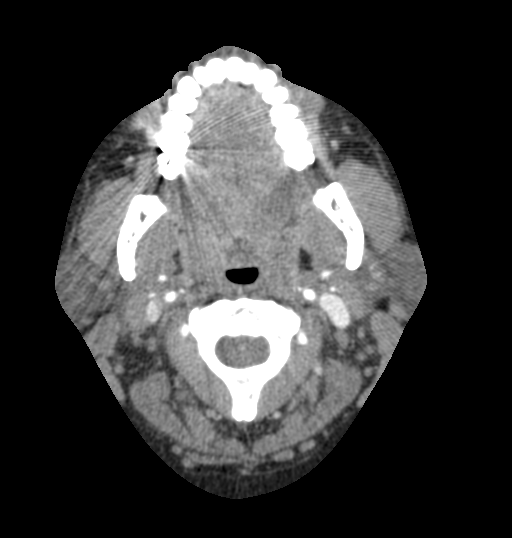
[im 188/220  soft-tissue]
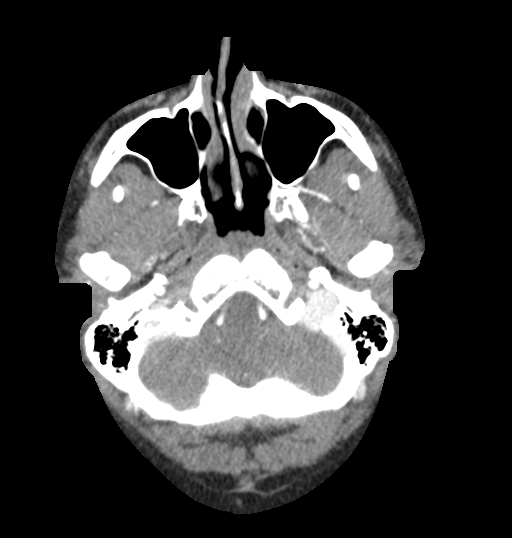

[8 of 33 positions shown; findings below may reference images not displayed]

FINDINGS: Aortic arch: Great vessel origins are patent.

Right carotid system: Patent. No stenosis or evidence of dissection.

Left carotid system: Patent.  No stenosis or evidence of dissection.

Vertebral arteries: Patent and codominant. No stenosis or evidence
of dissection.

Skeleton: Mild cervical spine degenerative changes.

Other neck: Unremarkable.

Upper chest: Included upper lungs are clear.
IMPRESSION: No evidence of arterial injury.

## 2022-08-17 NOTE — L&D Delivery Note (Signed)
Delivery Note Alexis Caldwell is a 37 y.o. [id number] at [redacted]w[redacted]d admitted for IOL for uncontrolled GDM as evidenced by polyhydramnios and out of range blood sugars.   GBS Status:  NEGATIVE/-- (05/09 2052)  Labor course: Initial SVE: 4/70/-3. Augmentation with: AROM and Pitocin. She then progressed to complete.  ROM: 1h 49m with clear fluid  Birth: Delivery of a Live born female  Birth Weight:   APGAR: 8, 9  Newborn Delivery   Birth date/time: 12/24[birth year]20[birth year] 22:05:00 Delivery type: Vaginal, Spontaneous         Delivered via spontaneous vaginal delivery (Presentation: OA ) by Teresa Pelton, RN, under my direct supervision. Nuchal cord present: No.  Shoulders and body delivered in usual fashion. Infant placed directly on mom's abdomen for bonding/skin-to-skin, baby dried and stimulated. Cord clamped x 2 after 1 minute and cut by FOB.  Cord blood collected. Placenta delivered-Spontaneous  with 3 vessels . 20u Pitocin in 500cc LR given as a bolus prior to delivery of placenta.  Fundus firm with massage. Placenta inspected and appears to be intact with a 3 VC.  Sponge and instrument count were correct x2.  Intrapartum complications:  None Anesthesia:  none Lacerations:  none Suture Repair:  EBL (mL):62.00     Mom to postpartum.  Baby to Couplet care / Skin to Skin. Placenta to L&D   Plans to Breastfeed Contraception: oral progesterone-only contraceptive Circumcision: N/A  Note sent to [facility]: [facility] for pp visit.  Delivery Report:  Review the Delivery Report for details.     Signed: Jacklyn Shell, DNP,CNM 12/24/2022, 10:39 PM

## 2022-09-05 ENCOUNTER — Encounter (HOSPITAL_COMMUNITY): Payer: Self-pay | Admitting: Family Medicine

## 2022-09-05 ENCOUNTER — Inpatient Hospital Stay (HOSPITAL_COMMUNITY)
Admission: AD | Admit: 2022-09-05 | Discharge: 2022-09-05 | Disposition: A | Discharge status: Home / Self Care | Payer: Medicaid Other | Attending: Obstetrics and Gynecology | Admitting: Obstetrics and Gynecology

## 2022-09-05 DIAGNOSIS — W19XXXA Unspecified fall, initial encounter: Secondary | ICD-10-CM

## 2022-09-05 DIAGNOSIS — M5459 Other low back pain: Secondary | ICD-10-CM

## 2022-09-05 DIAGNOSIS — O26892 Other specified pregnancy related conditions, second trimester: Secondary | ICD-10-CM | POA: Insufficient documentation

## 2022-09-05 DIAGNOSIS — Z3A21 21 weeks gestation of pregnancy: Secondary | ICD-10-CM

## 2022-09-05 DIAGNOSIS — W1839XA Other fall on same level, initial encounter: Secondary | ICD-10-CM | POA: Insufficient documentation

## 2022-09-05 DIAGNOSIS — R519 Headache, unspecified: Secondary | ICD-10-CM | POA: Diagnosis not present

## 2022-09-05 DIAGNOSIS — M5431 Sciatica, right side: Secondary | ICD-10-CM

## 2022-09-05 DIAGNOSIS — Z87891 Personal history of nicotine dependence: Secondary | ICD-10-CM | POA: Diagnosis not present

## 2022-09-05 DIAGNOSIS — O9A212 Injury, poisoning and certain other consequences of external causes complicating pregnancy, second trimester: Secondary | ICD-10-CM | POA: Insufficient documentation
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
## 2022-09-05 NOTE — Discharge Instructions (Signed)

## 2022-09-05 NOTE — MAU Provider Note (Signed)
History     CSN: [id number]  Arrival date and time: 09/05/22 1720   Event Date/Time   First Provider Initiated Contact with Patient 09/05/22 1808      Chief Complaint  Patient presents with   Back Pain   Headache   Fall   HPI  Alexis Caldwell is a 37 y.o. [id number] at [redacted]w[redacted]d who presents for evaluation of back pain and a headache after a fall yesterday. Patient reports she has struggled with sciatica for several years that has been managed by [facility]. She has not tried physical therapy. She states she had a flare of sciatica that made her lose her balance and fall forward where she caught herself on the table. Then she fell backwards. She reports since then she has had back pain and a headache. Patient rates the pain as a 8/10 and has not tried anything for the pain because she does not like taking medication. She denies any loss of bowel or bladder function with the fall. She denies any vaginal bleeding, discharge, and leaking of fluid. Denies any constipation, diarrhea or any urinary complaints. Reports normal fetal movement.   OB History     Gravida  5   Para  2   Term  2   Preterm      AB  2   Living  2      SAB  1   IAB  1   Ectopic      Multiple      Live Births  2           Past Medical History:  Diagnosis Date   Anxiety    Depression     Past Surgical History:  Procedure Laterality Date   NO PAST SURGERIES      Family History  Problem Relation Age of Onset   Hypertension Mother    Diabetes Mother    Diabetes Father     Social History   Tobacco Use   Smoking status: Former    Types: Cigarettes    Quit date: 04/15/2022    Years since quitting: 0.3   Smokeless tobacco: Never  Vaping Use   Vaping Use: Never used  Substance Use Topics   Alcohol use: Not Currently   Drug use: No    Allergies:  Allergies  Allergen Reactions   Penicillins     Medications Prior to Admission  Medication Sig Dispense Refill Last Dose    Prenatal Vit-Fe Fumarate-FA (PRENATAL MULTIVITAMIN) TABS tablet Take 1 tablet by mouth daily at 12 noon.   09/05/2022   acetaminophen (TYLENOL) 325 MG tablet Take 2 tablets (650 mg total) by mouth every 6 (six) hours as needed. (Patient not taking: Reported on 06/23/2022) 36 tablet 0     Review of Systems  Constitutional: Negative.  Negative for fatigue and fever.  HENT: Negative.    Respiratory: Negative.  Negative for shortness of breath.   Cardiovascular: Negative.  Negative for chest pain.  Gastrointestinal: Negative.  Negative for abdominal pain, constipation, diarrhea, nausea and vomiting.  Genitourinary: Negative.  Negative for dysuria, vaginal bleeding and vaginal discharge.  Musculoskeletal:  Positive for back pain.  Neurological:  Positive for headaches. Negative for dizziness.   Physical Exam   Blood pressure 118/73, pulse (!) 107, temperature 98.3 F (36.8 C), temperature source Oral, resp. rate 15, height 5\' 2"  (1.575 m), weight 102.2 kg, last menstrual period 03/06/2022, SpO2 97 %.  Patient Vitals for the past 24 hrs:  BP Temp Temp  src Pulse Resp SpO2 Height Weight  09/05/22 1751 118/73 98.3 F (36.8 C) Oral (!) 107 15 97 % 5\' 2"  (1.575 m) 102.2 kg    Physical Exam Vitals and nursing note reviewed.  Constitutional:      General: She is not in acute distress.    Appearance: She is well-developed.  HENT:     Head: Normocephalic.  Eyes:     Pupils: Pupils are equal, round, and reactive to light.  Cardiovascular:     Rate and Rhythm: Normal rate and regular rhythm.     Heart sounds: Normal heart sounds.  Pulmonary:     Effort: Pulmonary effort is normal. No respiratory distress.     Breath sounds: Normal breath sounds.  Abdominal:     General: Bowel sounds are normal. There is no distension.     Palpations: Abdomen is soft.     Tenderness: There is no abdominal tenderness.  Skin:    General: Skin is warm and dry.  Neurological:     Mental Status: She is alert  and oriented to person, place, and time.  Psychiatric:        Mood and Affect: Mood normal.        Behavior: Behavior normal.        Thought Content: Thought content normal.        Judgment: Judgment normal.    FHT: 154 bpm  MAU Course  Procedures  MDM Labs ordered and reviewed.   Pt informed that the ultrasound is considered a limited OB ultrasound and is not intended to be a complete ultrasound exam.  Patient also informed that the ultrasound is not being completed with the intent of assessing for fetal or placental anomalies or any pelvic abnormalities.  Explained that the purpose of today's ultrasound is to assess for  viability.  Patient acknowledges the purpose of the exam and the limitations of the study.    Live IUP with active movement noted with FHR 150 bpm   CNM offered treatment of pain in MAU- patient declines stating she just wanted to be sure baby is ok. Referral to PT placed  Assessment and Plan   1. Fall, initial encounter   2. [redacted] weeks gestation of pregnancy   3. Sciatica of right side    -Discharge home in stable condition -Second trimester precautions discussed -Patient advised to follow-up with OB as scheduled for prenatal care -Patient may return to MAU as needed or if her condition were to change or worsen  Wende Mott, CNM 09/05/2022, 6:08 PM

## 2022-09-08 ENCOUNTER — Other Ambulatory Visit: Payer: Self-pay | Admitting: Obstetrics and Gynecology

## 2022-09-08 DIAGNOSIS — Z363 Encounter for antenatal screening for malformations: Secondary | ICD-10-CM

## 2022-09-10 ENCOUNTER — Other Ambulatory Visit: Payer: Self-pay

## 2022-09-10 ENCOUNTER — Encounter: Payer: Self-pay | Admitting: Rehabilitative and Restorative Service Providers"

## 2022-09-10 ENCOUNTER — Ambulatory Visit: Payer: Medicaid Other | Admitting: Rehabilitative and Restorative Service Providers"

## 2022-09-10 DIAGNOSIS — Z3A21 21 weeks gestation of pregnancy: Secondary | ICD-10-CM | POA: Diagnosis not present

## 2022-09-10 DIAGNOSIS — O26892 Other specified pregnancy related conditions, second trimester: Secondary | ICD-10-CM | POA: Insufficient documentation

## 2022-09-10 DIAGNOSIS — W19XXXA Unspecified fall, initial encounter: Secondary | ICD-10-CM | POA: Insufficient documentation

## 2022-09-10 DIAGNOSIS — M5431 Sciatica, right side: Secondary | ICD-10-CM | POA: Insufficient documentation

## 2022-09-10 DIAGNOSIS — M6281 Muscle weakness (generalized): Secondary | ICD-10-CM

## 2022-09-10 DIAGNOSIS — R2689 Other abnormalities of gait and mobility: Secondary | ICD-10-CM

## 2022-09-10 DIAGNOSIS — R262 Difficulty in walking, not elsewhere classified: Secondary | ICD-10-CM

## 2022-09-10 NOTE — Patient Instructions (Signed)
     [facility] Welcome to [facility]! Here you will find all the information you will need regarding your pool therapy. If you have further questions at any time, please call our office at [phone]. After completing your initial evaluation in the [facility], you may be eligible to complete a portion of your therapy in the pool. A typical week of therapy will consist of 1-2 typical physical therapy visits at our [facility] location and an additional session of therapy in the pool located at the [facility] at [address]. [address], [place] [address]. The phone number at the pool site is [phone]. Please call this number if you are running late or need to cancel your appointment.  Aquatic therapy will be offered on Wednesday mornings and Friday afternoons. Each session will last approximately 45 minutes. All scheduling and payments for aquatic therapy sessions, including cancelations, will be done through our [facility] location.  To be eligible for aquatic therapy, these criteria must be met: You must be able to independently change in the locker room and get to the pool deck. A caregiver can come with you to help if needed. There are benches for a caregiver to sit on next to the pool. No one with an open wound is permitted in the pool.  Handicap parking is available in the front and there is a drop off option for even closer accessibility. Please arrive 15 minutes prior to your appointment to prepare for your pool session. You must sign in at the front desk upon your arrival. Please be sure to attend to any toileting needs prior to entering the pool. Locker rooms for changing are available.  There is direct access to the pool deck from the locker room. You can lock your belongings in a locker or bring them with you poolside. Your therapist will greet you on the pool deck. There may be other swimmers in the pool at the  same time but your session is one-on-one with the therapist.   

## 2022-09-10 NOTE — Therapy (Signed)
OUTPATIENT PHYSICAL THERAPY THORACOLUMBAR EVALUATION   Patient Name: Alexis Caldwell MRN: [id number] DOB:1986/01/06, 37 y.o., female Today's Date: 09/10/2022  END OF SESSION:  PT End of Session - 09/10/22 1243     Visit Number 1    Date for PT Re-Evaluation 11/06/22    Authorization Type [facility] Medicaid    Authorization Time Period auth requested 09/10/2022    PT Start Time 1238    PT Stop Time 1310    PT Time Calculation (min) 32 min    Activity Tolerance Patient tolerated treatment well    Behavior During Therapy [facility] for tasks assessed/performed             Past Medical History:  Diagnosis Date   Anxiety    Depression    Past Surgical History:  Procedure Laterality Date   NO PAST SURGERIES     Patient Active Problem List   Diagnosis Date Noted   Decreased vascular flow 06/16/2022   Lumbar radiculopathy 04/23/2022   History of gestational diabetes 06/20/2014   Depression, unspecified 10/11/2013    PCP: Trey Sailors, PA  REFERRING PROVIDER: Wende Mott, CNM  REFERRING DIAG: [phone].Merril Abbe (ICD-10-CM) - Fall, initial encounter Z3A.21 (ICD-10-CM) - [redacted] weeks gestation of pregnancy M54.31 (ICD-10-CM) - Sciatica of right side  Rationale for Evaluation and Treatment: Rehabilitation  THERAPY DIAG:  Sciatica, right side - Plan: PT plan of care cert/re-cert  Difficulty in walking, not elsewhere classified - Plan: PT plan of care cert/re-cert  Muscle weakness (generalized) - Plan: PT plan of care cert/re-cert  Other abnormalities of gait and mobility - Plan: PT plan of care cert/re-cert  ONSET DATE: 10/28/9700  SUBJECTIVE:                                                                                                                                                                                           SUBJECTIVE STATEMENT: Patient is currently pregnant with an estimated due date of 01/11/2023.  Pt reports that she has been having multiple falls with 3  falls in the past 6 months.  Pt reports that she is "kind of clumsy".  Pt reports her last fall was on 09/05/2022 when she tripped over her shoes and fell over her coffee table.  PERTINENT HISTORY:  Pregnant with estimated due date of 01/11/2023  PAIN:  Are you having pain? Yes: NPRS scale: 6-7/10 Pain location: back and down right leg Pain description: irritating, sharp Aggravating factors: sitting for prolonged periods Relieving factors: movement  PRECAUTIONS: Other: Pregnancy  WEIGHT BEARING RESTRICTIONS: No  FALLS:  Has patient fallen in last 6 months? Yes. Number of falls 3  with most recent fall on 09/05/2022  LIVING ENVIRONMENT: Lives with: lives with their family Lives in: House/apartment Stairs: No Has following equipment at home: None  OCCUPATION: Out of work  PLOF: Independent and Leisure: reading, go to the park  PATIENT GOALS: To be able to feel less pain, improved balance, to be able to "go back to my normal life."  NEXT MD VISIT: OB appointment mid-February  OBJECTIVE:   DIAGNOSTIC FINDINGS:  N/A  PATIENT SURVEYS:  09/10/2022: LEFS 13 / 80 = 16.3 %  SCREENING FOR RED FLAGS: Bowel or bladder incontinence: No Spinal tumors: No Cauda equina syndrome: No Compression fracture: No Abdominal aneurysm: No  COGNITION: Overall cognitive status: Within functional limits for tasks assessed     SENSATION: Reports "coldness" and numbness feeling below right knee  MUSCLE LENGTH: Hamstrings: tightness noted bilat   POSTURE: rounded shoulders, forward head, and anterior pelvic tilt  PALPATION: Tenderness to palpation to bilateral lumbar paraspinals  LUMBAR ROM:   09/10/2022:  Limited secondary to pain and pregnancy   LOWER EXTREMITY MMT:    09/10/2022:  Bilateral hip strength of 4/5  FUNCTIONAL TESTS:  09/10/2022: 5 times sit to stand: 24.4 sec 3 minute walk test: 446 ft   TODAY'S TREATMENT:                                                                                                                                DATE: 09/10/2022  Issued HEP (see below)  Check all possible CPT codes: 16109 - PT Re-evaluation, 97110- Therapeutic Exercise, [phone]- Neuro Re-education, [phone] - Gait Training, [phone] - Manual Therapy, 97530 - Therapeutic Activities, [phone] - Ultrasound, and [id number] - Aquatic therapy    Check all conditions that are expected to impact treatment: Musculoskeletal disorders and Current pregnancy or recent postpartum   If treatment provided at initial evaluation, no treatment charged due to lack of authorization.        PATIENT EDUCATION:  Education details: Issued HEP Person educated: Patient Education method: Explanation, Demonstration, and Handouts Education comprehension: verbalized understanding and returned demonstration  HOME EXERCISE PROGRAM: Access Code: [email or web address] URL: [email or web address] Date: 09/10/2022 Prepared by: Clydie Braun Rayn Enderson  Exercises - Seated Scapular Retraction  - 1 x daily - 7 x weekly - 2 sets - 10 reps - Seated Hamstring Stretch  - 1 x daily - 7 x weekly - 1 sets - 2 reps - 20 sec hold - Seated Posterior Pelvic Tilt  - 1 x daily - 7 x weekly - 2 sets - 10 reps - Seated Transversus Abdominis Bracing  - 1 x daily - 7 x weekly - 2 sets - 10 reps - Supine Posterior Pelvic Tilt  - 1 x daily - 7 x weekly - 2 sets - 10 reps  ASSESSMENT:  CLINICAL IMPRESSION: Patient is a 37 y.o. female who was seen today for physical therapy evaluation and treatment for fall during pregnancy with right sided sciatica. Patient states  that she recently joined the [facility] in hopes that she could do aquatic classes, but she has not been able to start them yet due to the recent increased pain.  Patient presents with increased pain, hip weakness, decreased balance with recent falls, and difficulty walking for prolonged distances.  Patient would benefit from skilled PT to address her functional impairments to allow her  to perform functional tasks with decreased pain.  OBJECTIVE IMPAIRMENTS: decreased balance, difficulty walking, decreased strength, increased muscle spasms, impaired flexibility, postural dysfunction, and pain.   ACTIVITY LIMITATIONS: lifting, bending, sitting, and squatting  PARTICIPATION LIMITATIONS: cleaning, laundry, and community activity  PERSONAL FACTORS: Time since onset of injury/illness/exacerbation and 1 comorbidity: Pregnancy with EDD of 01/11/23  are also affecting patient's functional outcome.   REHAB POTENTIAL: Good  CLINICAL DECISION MAKING: Evolving/moderate complexity  EVALUATION COMPLEXITY: Moderate   GOALS: Goals reviewed with patient? Yes  SHORT TERM GOALS: Target date: 09/25/2022  Patient will be independent with initial HEP. Baseline: Goal status: INITIAL  2.  Patient will report at least a 30% improvement in status since initial evaluation. Baseline:  Goal status: INITIAL   LONG TERM GOALS: Target date: 11/06/2022  Patient will be independent with advanced HEP and knowledgeable with self progression. Baseline:  Goal status: INITIAL  2.  Patient will increase Lower Extremity Functional Scale to at least 40% to allow her to perform more tasks around home with decreased pain. Baseline: 16.3% Goal status: INITIAL  3.  Patient will increase bilateral hip strength to at least 5-/5 to allow patient to perform functional tasks around home. Baseline:  Goal status: INITIAL  4.  Patient will report ability to go to the park or exercise at the [facility] without increased pain. Baseline:  Goal status: INITIAL   PLAN:  PT FREQUENCY: 2x/week  PT DURATION: 8 weeks  PLANNED INTERVENTIONS: Therapeutic exercises, Therapeutic activity, Neuromuscular re-education, Balance training, Gait training, Patient/Family education, Self Care, Joint mobilization, Joint manipulation, Aquatic Therapy, Dry Needling, Electrical stimulation, Spinal manipulation, Spinal mobilization,  Cryotherapy, Moist heat, Taping, Ultrasound, Ionotophoresis 4mg /ml Dexamethasone, Manual therapy, and Re-evaluation.  PLAN FOR NEXT SESSION: assess and progress HEP as indicated, strengthening, core stability   Juel Burrow, PT 09/10/2022, 1:35 PM   [facility] [address], [place], [place] [address] Phone # [phone] Fax [phone]

## 2022-09-11 ENCOUNTER — Ambulatory Visit: Payer: Medicaid Other | Admitting: Physical Therapy

## 2022-09-11 ENCOUNTER — Encounter: Payer: Self-pay | Admitting: Physical Therapy

## 2022-09-11 DIAGNOSIS — M5431 Sciatica, right side: Secondary | ICD-10-CM

## 2022-09-11 DIAGNOSIS — R2689 Other abnormalities of gait and mobility: Secondary | ICD-10-CM

## 2022-09-11 DIAGNOSIS — O26892 Other specified pregnancy related conditions, second trimester: Secondary | ICD-10-CM | POA: Diagnosis not present

## 2022-09-11 DIAGNOSIS — M6281 Muscle weakness (generalized): Secondary | ICD-10-CM

## 2022-09-11 DIAGNOSIS — R262 Difficulty in walking, not elsewhere classified: Secondary | ICD-10-CM

## 2022-09-11 NOTE — Therapy (Deleted)
  OUTPATIENT PHYSICAL THERAPY TREATMENT NOTE   Patient Name: Alexis Caldwell MRN: [id number] DOB:10-06-1985, 37 y.o., female Today's Date: 09/11/2022  PCP: Marland Kitchen REFERRING PROVIDER: ***  END OF SESSION:   PT End of Session - 09/11/22 1951     Visit Number 2    Date for PT Re-Evaluation 11/06/22    Authorization Type [facility] Medicaid    Authorization Time Period auth requested 09/10/2022    PT Start Time 1300    PT Stop Time 1345    PT Time Calculation (min) 45 min    Activity Tolerance Patient tolerated treatment well    Behavior During Therapy [facility] for tasks assessed/performed             Past Medical History:  Diagnosis Date   Anxiety    Depression    Past Surgical History:  Procedure Laterality Date   NO PAST SURGERIES     Patient Active Problem List   Diagnosis Date Noted   Decreased vascular flow 06/16/2022   Lumbar radiculopathy 04/23/2022   History of gestational diabetes 06/20/2014   Depression, unspecified 10/11/2013    REFERRING DIAG: ***  THERAPY DIAG:  Sciatica, right side  Difficulty in walking, not elsewhere classified  Muscle weakness (generalized)  Other abnormalities of gait and mobility  Rationale for Evaluation and Treatment {HABREHAB:27488}  PERTINENT HISTORY: ***  PRECAUTIONS: ***  SUBJECTIVE:                                                                                                                                                                                      SUBJECTIVE STATEMENT:  ***   PAIN:  Are you having pain? {OPRCPAIN:27236}   OBJECTIVE: (objective measures completed at initial evaluation unless otherwise dated)   (Copy Eval's Objective through Plan section here)   Donat Humble, PTA 09/11/2022, 7:52 PM

## 2022-09-11 NOTE — Therapy (Signed)
OUTPATIENT PHYSICAL THERAPY TREATMENT NOTE   Patient Name: Alexis Caldwell MRN: [id number] DOB:1985/11/07, 37 y.o., female Today's Date: 09/11/2022  PCP: Trey Sailors, [place]  REFERRING PROVIDER: Wende Mott, CNM   END OF SESSION:   PT End of Session - 09/11/22 1951     Visit Number 2    Date for PT Re-Evaluation 11/06/22    Authorization Type [facility] Medicaid    Authorization Time Period auth requested 09/10/2022    PT Start Time 1300    PT Stop Time 1345    PT Time Calculation (min) 45 min    Activity Tolerance Patient tolerated treatment well    Behavior During Therapy [facility] for tasks assessed/performed             Past Medical History:  Diagnosis Date   Anxiety    Depression    Past Surgical History:  Procedure Laterality Date   NO PAST SURGERIES     Patient Active Problem List   Diagnosis Date Noted   Decreased vascular flow 06/16/2022   Lumbar radiculopathy 04/23/2022   History of gestational diabetes 06/20/2014   Depression, unspecified 10/11/2013    REFERRING DIAG: E36.Merril Abbe (ICD-10-CM) - Fall, initial encounter Z3A.21 (ICD-10-CM) - [redacted] weeks gestation of pregnancy M54.31 (ICD-10-CM) - Sciatica of right side   THERAPY DIAG:  Sciatica, right side  Difficulty in walking, not elsewhere classified  Muscle weakness (generalized)  Other abnormalities of gait and mobility  Rationale for Evaluation and Treatment Rehabilitation  PERTINENT HISTORY: Pregnant with estimated due date of 01/11/2023   PRECAUTIONS: Other: Pregnancy   SUBJECTIVE:                                                                                                                                                                                      SUBJECTIVE STATEMENT:  Not too bad today, legs feel disconnected.   PAIN:  Are you having pain? No   OBJECTIVE: (objective measures completed at initial evaluation unless otherwise dated)  DIAGNOSTIC FINDINGS:  N/A   PATIENT  SURVEYS:  09/10/2022: LEFS 13 / 80 = 16.3 %   SCREENING FOR RED FLAGS: Bowel or bladder incontinence: No Spinal tumors: No Cauda equina syndrome: No Compression fracture: No Abdominal aneurysm: No   COGNITION: Overall cognitive status: Within functional limits for tasks assessed                          SENSATION: Reports "coldness" and numbness feeling below right knee   MUSCLE LENGTH: Hamstrings: tightness noted bilat     POSTURE: rounded shoulders, forward head, and anterior pelvic tilt   PALPATION: Tenderness to  palpation to bilateral lumbar paraspinals   LUMBAR ROM:    09/10/2022:  Limited secondary to pain and pregnancy     LOWER EXTREMITY MMT:     09/10/2022:  Bilateral hip strength of 4/5   FUNCTIONAL TESTS:  09/10/2022: 5 times sit to stand: 24.4 sec 3 minute walk test: 446 ft     TODAY'S TREATMENT:   09/11/22: Pt arrives for aquatic physical therapy. Treatment took place in 3.5-5.5 feet of water. Water temperature was 91 degrees F. Pt entered the pool via stairs, slowly with caution and use of hand rails. Pt requires buoyancy of water for support and to offload joints with strengthening exercises.  Seated water bench with 75% submersion Pt performed seated LE AROM exercises 20x in all planes, pt educated in water principles and how we use them. Pt verbally understood. 75% depth water walking 6 lengths of each direction with small noodle for postural support. Post Pelvic tilt against the wall with half noodle lat/core press 6x. Standing hip 3 ways 10x slowly Bil. Requires UE for balance.Hamstring strtech on second step 2x Bil 20 sec.Horizontal decompression float with 100% flotation and PTA providing lateral sway to mobilize trunk.                                                                                                                                DATE: 09/10/2022  Issued HEP (see below)   Check all possible CPT codes: 97164 - PT Re-evaluation, 97110-  Therapeutic Exercise, [phone]- Neuro Re-education, [phone] - Gait Training, [phone] - Manual Therapy, [phone] - Therapeutic Activities, [phone] - Ultrasound, and [id number] - Aquatic therapy                         Check all conditions that are expected to impact treatment: Musculoskeletal disorders and Current pregnancy or recent postpartum             If treatment provided at initial evaluation, no treatment charged due to lack of authorization.                          PATIENT EDUCATION:  Education details: Issued HEP Person educated: Patient Education method: Explanation, Demonstration, and Handouts Education comprehension: verbalized understanding and returned demonstration   HOME EXERCISE PROGRAM: Access Code: [email or web address] URL: [email or web address] Date: 09/10/2022 Prepared by: Shelby Dubin Menke   Exercises - Seated Scapular Retraction  - 1 x daily - 7 x weekly - 2 sets - 10 reps - Seated Hamstring Stretch  - 1 x daily - 7 x weekly - 1 sets - 2 reps - 20 sec hold - Seated Posterior Pelvic Tilt  - 1 x daily - 7 x weekly - 2 sets - 10 reps - Seated Transversus Abdominis Bracing  - 1 x daily - 7 x weekly - 2 sets - 10 reps - Supine Posterior Pelvic Tilt  - 1 x  daily - 7 x weekly - 2 sets - 10 reps   ASSESSMENT:   CLINICAL IMPRESSION:  Pt arrived to first time aquatic Pt treatment with no pain but a feeling of disconnect to her legs. Pt was able to exercise/move in 75% depth submersion without any issue. Pt reports she felt light and "good" when she is in the water. Pt is thinking of [facility] [employer] at the end of her PT POC to continue with water exercises. OBJECTIVE IMPAIRMENTS: decreased balance, difficulty walking, decreased strength, increased muscle spasms, impaired flexibility, postural dysfunction, and pain.    ACTIVITY LIMITATIONS: lifting, bending, sitting, and squatting   PARTICIPATION LIMITATIONS: cleaning, laundry, and community activity   PERSONAL FACTORS: Time since  onset of injury/illness/exacerbation and 1 comorbidity: Pregnancy with EDD of 01/11/23  are also affecting patient's functional outcome.    REHAB POTENTIAL: Good   CLINICAL DECISION MAKING: Evolving/moderate complexity   EVALUATION COMPLEXITY: Moderate     GOALS: Goals reviewed with patient? Yes   SHORT TERM GOALS: Target date: 09/25/2022   Patient will be independent with initial HEP. Baseline: Goal status: INITIAL   2.  Patient will report at least a 30% improvement in status since initial evaluation. Baseline:  Goal status: INITIAL     LONG TERM GOALS: Target date: 11/06/2022   Patient will be independent with advanced HEP and knowledgeable with self progression. Baseline:  Goal status: INITIAL   2.  Patient will increase Lower Extremity Functional Scale to at least 40% to allow her to perform more tasks around home with decreased pain. Baseline: 16.3% Goal status: INITIAL   3.  Patient will increase bilateral hip strength to at least 5-/5 to allow patient to perform functional tasks around home. Baseline:  Goal status: INITIAL   4.  Patient will report ability to go to the park or exercise at the [facility] without increased pain. Baseline:  Goal status: INITIAL     PLAN:   PT FREQUENCY: 2x/week   PT DURATION: 8 weeks   PLANNED INTERVENTIONS: Therapeutic exercises, Therapeutic activity, Neuromuscular re-education, Balance training, Gait training, Patient/Family education, Self Care, Joint mobilization, Joint manipulation, Aquatic Therapy, Dry Needling, Electrical stimulation, Spinal manipulation, Spinal mobilization, Cryotherapy, Moist heat, Taping, Ultrasound, Ionotophoresis 4mg /ml Dexamethasone, Manual therapy, and Re-evaluation.   PLAN FOR NEXT SESSION: See how aquatics went  [facility], PTA 09/11/2022, 8:11 PM

## 2022-09-15 NOTE — Therapy (Unsigned)
OUTPATIENT PHYSICAL THERAPY TREATMENT NOTE   Patient Name: Alexis Caldwell MRN: [id number] DOB:[birth year], 37 y.o., female Today's Date: 09/16/2022  PCP: Trey Sailors, [place]  REFERRING PROVIDER: Wende Mott, CNM   END OF SESSION:   PT End of Session - 09/16/22 1831     Visit Number 2    Date for PT Re-Evaluation 11/06/22    Authorization Type [facility] Medicaid    Authorization Time Period auth requested 09/10/2022    PT Start Time 1025    PT Stop Time 1110    PT Time Calculation (min) 45 min    Activity Tolerance Patient tolerated treatment well    Behavior During Therapy [employer] for tasks assessed/performed              Past Medical History:  Diagnosis Date   Anxiety    Depression    Past Surgical History:  Procedure Laterality Date   NO PAST SURGERIES     Patient Active Problem List   Diagnosis Date Noted   Decreased vascular flow 06/16/2022   Lumbar radiculopathy 04/23/2022   History of gestational diabetes 06/20/2014   Depression, unspecified 10/11/2013    REFERRING DIAG: W40.Merril Abbe (ICD-10-CM) - Fall, initial encounter Z3A.21 (ICD-10-CM) - [redacted] weeks gestation of pregnancy M54.31 (ICD-10-CM) - Sciatica of right side   THERAPY DIAG:  Sciatica, right side  Difficulty in walking, not elsewhere classified  Muscle weakness (generalized)  Other abnormalities of gait and mobility  Rationale for Evaluation and Treatment Rehabilitation  PERTINENT HISTORY: Pregnant with estimated due date of 01/11/2023   PRECAUTIONS: Other: Pregnancy   SUBJECTIVE:                                                                                                                                                                                      SUBJECTIVE STATEMENT: I did fine after the pool therapy. No increased pain, RTLE feels about the same but no worse and I was not very tired. PAIN:  Are you having pain? No   OBJECTIVE: (objective measures completed at initial  evaluation unless otherwise dated)  DIAGNOSTIC FINDINGS:  N/A   PATIENT SURVEYS:  09/10/2022: LEFS 13 / 80 = 16.3 %   SCREENING FOR RED FLAGS: Bowel or bladder incontinence: No Spinal tumors: No Cauda equina syndrome: No Compression fracture: No Abdominal aneurysm: No   COGNITION: Overall cognitive status: Within functional limits for tasks assessed                          SENSATION: Reports "coldness" and numbness feeling below right knee   MUSCLE LENGTH: Hamstrings: tightness noted bilat  POSTURE: rounded shoulders, forward head, and anterior pelvic tilt   PALPATION: Tenderness to palpation to bilateral lumbar paraspinals   LUMBAR ROM:    09/10/2022:  Limited secondary to pain and pregnancy     LOWER EXTREMITY MMT:     09/10/2022:  Bilateral hip strength of 4/5   FUNCTIONAL TESTS:  09/10/2022: 5 times sit to stand: 24.4 sec 3 minute walk test: 446 ft     TODAY'S TREATMENT:   09/16/22:Pt arrives for aquatic physical therapy. Treatment took place in 3.5-5.5 feet of water. Water temperature was 91 degrees F. Pt entered the pool via stairs, slowly with caution and use of hand rails. Pt requires buoyancy of water for support and to offload joints with strengthening exercises.  Seated water bench with 75% submersion Pt performed seated LE AROM exercises 20x in all planes while discussing current status. 75% depth water walking 8 lengths of each direction with small noodle for postural support. Post Pelvic tilt against the wall with half noodle lat/core press 10x. Standing hip 3 ways 15x slowly Bil.Semi seated single buoy UE weights horizontal abd/add 20x. Requires UE for balance.Hamstring stretch on second step 3x Bil 20 sec.Horizontal decompression float with 100% flotation and PTA providing lateral sway to mobilize trunk.                                                                                                                               09/11/22: Pt arrives  for aquatic physical therapy. Treatment took place in 3.5-5.5 feet of water. Water temperature was 91 degrees F. Pt entered the pool via stairs, slowly with caution and use of hand rails. Pt requires buoyancy of water for support and to offload joints with strengthening exercises.  Seated water bench with 75% submersion Pt performed seated LE AROM exercises 20x in all planes, pt educated in water principles and how we use them. Pt verbally understood. 75% depth water walking 6 lengths of each direction with small noodle for postural support. Post Pelvic tilt against the wall with half noodle lat/core press 6x. Standing hip 3 ways 10x slowly Bil. Requires UE for balance.Hamstring strtech on second step 2x Bil 20 sec.Horizontal decompression float with 100% flotation and PTA providing lateral sway to mobilize trunk.  DATE: 09/10/2022  Issued HEP (see below)   Check all possible CPT codes: 97164 - PT Re-evaluation, 97110- Therapeutic Exercise, [phone]- Neuro Re-education, [phone] - Gait Training, [phone] - Manual Therapy, [phone] - Therapeutic Activities, [phone] - Ultrasound, and [id number] - Aquatic therapy                         Check all conditions that are expected to impact treatment: Musculoskeletal disorders and Current pregnancy or recent postpartum             If treatment provided at initial evaluation, no treatment charged due to lack of authorization.                          PATIENT EDUCATION:  Education details: Issued HEP Person educated: Patient Education method: Explanation, Demonstration, and Handouts Education comprehension: verbalized understanding and returned demonstration   HOME EXERCISE PROGRAM: Access Code: [email or web address] URL: [email or web address] Date: 09/10/2022 Prepared by: Shelby Dubin Menke   Exercises - Seated Scapular Retraction  - 1 x daily - 7  x weekly - 2 sets - 10 reps - Seated Hamstring Stretch  - 1 x daily - 7 x weekly - 1 sets - 2 reps - 20 sec hold - Seated Posterior Pelvic Tilt  - 1 x daily - 7 x weekly - 2 sets - 10 reps - Seated Transversus Abdominis Bracing  - 1 x daily - 7 x weekly - 2 sets - 10 reps - Supine Posterior Pelvic Tilt  - 1 x daily - 7 x weekly - 2 sets - 10 reps   ASSESSMENT:   CLINICAL IMPRESSION:  Pt with good response from aquatic PT. No excess fatigue or pain that would complicate her pregnancy. Pt verbally stated today "this is going to work." [facility] [employer] [facility] 1x. She reports the warmer water does feel better. Increase in overall work level today with another positive response.  OBJECTIVE IMPAIRMENTS: decreased balance, difficulty walking, decreased strength, increased muscle spasms, impaired flexibility, postural dysfunction, and pain.    ACTIVITY LIMITATIONS: lifting, bending, sitting, and squatting   PARTICIPATION LIMITATIONS: cleaning, laundry, and community activity   PERSONAL FACTORS: Time since onset of injury/illness/exacerbation and 1 comorbidity: Pregnancy with EDD of 01/11/23  are also affecting patient's functional outcome.    REHAB POTENTIAL: Good   CLINICAL DECISION MAKING: Evolving/moderate complexity   EVALUATION COMPLEXITY: Moderate     GOALS: Goals reviewed with patient? Yes   SHORT TERM GOALS: Target date: 09/25/2022   Patient will be independent with initial HEP. Baseline: Goal status: INITIAL   2.  Patient will report at least a 30% improvement in status since initial evaluation. Baseline:  Goal status: INITIAL     LONG TERM GOALS: Target date: 11/06/2022   Patient will be independent with advanced HEP and knowledgeable with self progression. Baseline:  Goal status: INITIAL   2.  Patient will increase Lower Extremity Functional Scale to at least 40% to allow her to perform more tasks around home with decreased pain. Baseline:  16.3% Goal status: INITIAL   3.  Patient will increase bilateral hip strength to at least 5-/5 to allow patient to perform functional tasks around home. Baseline:  Goal status: INITIAL   4.  Patient will report ability to go to the park or exercise at the [facility] without increased pain. Baseline:  Goal status: INITIAL     PLAN:  PT FREQUENCY: 2x/week   PT DURATION: 8 weeks   PLANNED INTERVENTIONS: Therapeutic exercises, Therapeutic activity, Neuromuscular re-education, Balance training, Gait training, Patient/Family education, Self Care, Joint mobilization, Joint manipulation, Aquatic Therapy, Dry Needling, Electrical stimulation, Spinal manipulation, Spinal mobilization, Cryotherapy, Moist heat, Taping, Ultrasound, Ionotophoresis 4mg /ml Dexamethasone, Manual therapy, and Re-evaluation.   PLAN FOR NEXT SESSION: , PTA 09/16/2022, 6:33 PM

## 2022-09-16 ENCOUNTER — Ambulatory Visit: Payer: Medicaid Other | Admitting: Physical Therapy

## 2022-09-16 ENCOUNTER — Encounter: Payer: Self-pay | Admitting: Physical Therapy

## 2022-09-16 DIAGNOSIS — M5431 Sciatica, right side: Secondary | ICD-10-CM

## 2022-09-16 DIAGNOSIS — O26892 Other specified pregnancy related conditions, second trimester: Secondary | ICD-10-CM | POA: Diagnosis not present

## 2022-09-16 DIAGNOSIS — R262 Difficulty in walking, not elsewhere classified: Secondary | ICD-10-CM

## 2022-09-16 DIAGNOSIS — M6281 Muscle weakness (generalized): Secondary | ICD-10-CM

## 2022-09-16 DIAGNOSIS — R2689 Other abnormalities of gait and mobility: Secondary | ICD-10-CM

## 2022-09-23 ENCOUNTER — Ambulatory Visit: Payer: Medicaid Other | Admitting: Rehabilitative and Restorative Service Providers"

## 2022-09-24 NOTE — Therapy (Deleted)
OUTPATIENT PHYSICAL THERAPY TREATMENT NOTE   Patient Name: Alexis Caldwell MRN: [id number] DOB:[birth year], 37 y.o., female Today's Date: 09/24/2022  PCP: Trey Sailors, [place]  REFERRING PROVIDER: Wende Mott, CNM   END OF SESSION:      Past Medical History:  Diagnosis Date   Anxiety    Depression    Past Surgical History:  Procedure Laterality Date   NO PAST SURGERIES     Patient Active Problem List   Diagnosis Date Noted   Decreased vascular flow 06/16/2022   Lumbar radiculopathy 04/23/2022   History of gestational diabetes 06/20/2014   Depression, unspecified 10/11/2013    REFERRING DIAG: T06.Merril Abbe (ICD-10-CM) - Fall, initial encounter Z3A.21 (ICD-10-CM) - [redacted] weeks gestation of pregnancy M54.31 (ICD-10-CM) - Sciatica of right side   THERAPY DIAG:  Sciatica, right side  Difficulty in walking, not elsewhere classified  Muscle weakness (generalized)  Other abnormalities of gait and mobility  Rationale for Evaluation and Treatment Rehabilitation  PERTINENT HISTORY: Pregnant with estimated due date of 01/11/2023   PRECAUTIONS: Other: Pregnancy   SUBJECTIVE:                                                                                                                                                                                      SUBJECTIVE STATEMENT: I did fine after the pool therapy. No increased pain, RTLE feels about the same but no worse and I was not very tired. PAIN:  Are you having pain? No   OBJECTIVE: (objective measures completed at initial evaluation unless otherwise dated)  DIAGNOSTIC FINDINGS:  N/A   PATIENT SURVEYS:  09/10/2022: LEFS 13 / 80 = 16.3 %   SCREENING FOR RED FLAGS: Bowel or bladder incontinence: No Spinal tumors: No Cauda equina syndrome: No Compression fracture: No Abdominal aneurysm: No   COGNITION: Overall cognitive status: Within functional limits for tasks assessed                           SENSATION: Reports "coldness" and numbness feeling below right knee   MUSCLE LENGTH: Hamstrings: tightness noted bilat     POSTURE: rounded shoulders, forward head, and anterior pelvic tilt   PALPATION: Tenderness to palpation to bilateral lumbar paraspinals   LUMBAR ROM:    09/10/2022:  Limited secondary to pain and pregnancy     LOWER EXTREMITY MMT:     09/10/2022:  Bilateral hip strength of 4/5   FUNCTIONAL TESTS:  09/10/2022: 5 times sit to stand: 24.4 sec 3 minute walk test: 446 ft     TODAY'S TREATMENT:   09/16/22:Pt arrives for aquatic physical therapy. Treatment took place in  3.5-5.5 feet of water. Water temperature was 91 degrees F. Pt entered the pool via stairs, slowly with caution and use of hand rails. Pt requires buoyancy of water for support and to offload joints with strengthening exercises.  Seated water bench with 75% submersion Pt performed seated LE AROM exercises 20x in all planes while discussing current status. 75% depth water walking 8 lengths of each direction with small noodle for postural support. Post Pelvic tilt against the wall with half noodle lat/core press 10x. Standing hip 3 ways 15x slowly Bil.Semi seated single buoy UE weights horizontal abd/add 20x. Requires UE for balance.Hamstring stretch on second step 3x Bil 20 sec.Horizontal decompression float with 100% flotation and PTA providing lateral sway to mobilize trunk.                                                                                                                               09/11/22: Pt arrives for aquatic physical therapy. Treatment took place in 3.5-5.5 feet of water. Water temperature was 91 degrees F. Pt entered the pool via stairs, slowly with caution and use of hand rails. Pt requires buoyancy of water for support and to offload joints with strengthening exercises.  Seated water bench with 75% submersion Pt performed seated LE AROM exercises 20x in all planes, pt educated in  water principles and how we use them. Pt verbally understood. 75% depth water walking 6 lengths of each direction with small noodle for postural support. Post Pelvic tilt against the wall with half noodle lat/core press 6x. Standing hip 3 ways 10x slowly Bil. Requires UE for balance.Hamstring strtech on second step 2x Bil 20 sec.Horizontal decompression float with 100% flotation and PTA providing lateral sway to mobilize trunk.                                                                                                                                DATE: 09/10/2022  Issued HEP (see below)   Check all possible CPT codes: 97164 - PT Re-evaluation, 97110- Therapeutic Exercise, [phone]- Neuro Re-education, [phone] - Gait Training, [phone] - Manual Therapy, 97530 - Therapeutic Activities, [phone] - Ultrasound, and [id number] - Aquatic therapy                         Check all conditions that are expected to impact treatment: Musculoskeletal disorders and Current pregnancy  or recent postpartum             If treatment provided at initial evaluation, no treatment charged due to lack of authorization.                          PATIENT EDUCATION:  Education details: Issued HEP Person educated: Patient Education method: Explanation, Demonstration, and Handouts Education comprehension: verbalized understanding and returned demonstration   HOME EXERCISE PROGRAM: Access Code: [email or web address] URL: [email or web address] Date: 09/10/2022 Prepared by: Shelby Dubin Menke   Exercises - Seated Scapular Retraction  - 1 x daily - 7 x weekly - 2 sets - 10 reps - Seated Hamstring Stretch  - 1 x daily - 7 x weekly - 1 sets - 2 reps - 20 sec hold - Seated Posterior Pelvic Tilt  - 1 x daily - 7 x weekly - 2 sets - 10 reps - Seated Transversus Abdominis Bracing  - 1 x daily - 7 x weekly - 2 sets - 10 reps - Supine Posterior Pelvic Tilt  - 1 x daily - 7 x weekly - 2 sets - 10 reps   ASSESSMENT:   CLINICAL IMPRESSION:   Pt with good response from aquatic PT. No excess fatigue or pain that would complicate her pregnancy. Pt verbally stated today "this is going to work." She has already gone to the [employer] to do some walking 1x. She reports the warmer water does feel better. Increase in overall work level today with another positive response.  OBJECTIVE IMPAIRMENTS: decreased balance, difficulty walking, decreased strength, increased muscle spasms, impaired flexibility, postural dysfunction, and pain.    ACTIVITY LIMITATIONS: lifting, bending, sitting, and squatting   PARTICIPATION LIMITATIONS: cleaning, laundry, and community activity   PERSONAL FACTORS: Time since onset of injury/illness/exacerbation and 1 comorbidity: Pregnancy with EDD of 01/11/23  are also affecting patient's functional outcome.    REHAB POTENTIAL: Good   CLINICAL DECISION MAKING: Evolving/moderate complexity   EVALUATION COMPLEXITY: Moderate     GOALS: Goals reviewed with patient? Yes   SHORT TERM GOALS: Target date: 09/25/2022   Patient will be independent with initial HEP. Baseline: Goal status: INITIAL   2.  Patient will report at least a 30% improvement in status since initial evaluation. Baseline:  Goal status: INITIAL     LONG TERM GOALS: Target date: 11/06/2022   Patient will be independent with advanced HEP and knowledgeable with self progression. Baseline:  Goal status: INITIAL   2.  Patient will increase Lower Extremity Functional Scale to at least 40% to allow her to perform more tasks around home with decreased pain. Baseline: 16.3% Goal status: INITIAL   3.  Patient will increase bilateral hip strength to at least 5-/5 to allow patient to perform functional tasks around home. Baseline:  Goal status: INITIAL   4.  Patient will report ability to go to the park or exercise at the [facility] without increased pain. Baseline:  Goal status: INITIAL     PLAN:   PT FREQUENCY: 2x/week   PT DURATION: 8 weeks    PLANNED INTERVENTIONS: Therapeutic exercises, Therapeutic activity, Neuromuscular re-education, Balance training, Gait training, Patient/Family education, Self Care, Joint mobilization, Joint manipulation, Aquatic Therapy, Dry Needling, Electrical stimulation, Spinal manipulation, Spinal mobilization, Cryotherapy, Moist heat, Taping, Ultrasound, Ionotophoresis 4mg /ml Dexamethasone, Manual therapy, and Re-evaluation.   PLAN FOR NEXT SESSION: Aquatics  Khye Hochstetler, PTA 09/24/2022, 9:29 PM

## 2022-09-25 ENCOUNTER — Ambulatory Visit: Payer: Medicaid Other | Admitting: Physical Therapy

## 2022-09-25 DIAGNOSIS — M5431 Sciatica, right side: Secondary | ICD-10-CM

## 2022-09-25 DIAGNOSIS — R262 Difficulty in walking, not elsewhere classified: Secondary | ICD-10-CM

## 2022-09-25 DIAGNOSIS — R2689 Other abnormalities of gait and mobility: Secondary | ICD-10-CM

## 2022-09-25 DIAGNOSIS — M6281 Muscle weakness (generalized): Secondary | ICD-10-CM

## 2022-09-29 ENCOUNTER — Ambulatory Visit: Payer: Medicaid Other

## 2022-09-29 ENCOUNTER — Other Ambulatory Visit: Payer: Medicaid Other

## 2022-09-29 NOTE — Therapy (Unsigned)
OUTPATIENT PHYSICAL THERAPY TREATMENT NOTE   Patient Name: Alexis Caldwell MRN: [id number] DOB:04-11-1986, 37 y.o., female Today's Date: 09/30/2022  PCP: Trey Sailors, [place]  REFERRING PROVIDER: Wende Mott, CNM   END OF SESSION:   PT End of Session - 09/30/22 2153     Visit Number 3    Date for PT Re-Evaluation 11/06/22    Authorization Type [facility] Medicaid    Authorization Time Period auth requested 09/10/2022    PT Start Time 1145    PT Stop Time 1235    PT Time Calculation (min) 50 min    Activity Tolerance Patient tolerated treatment well    Behavior During Therapy [facility] for tasks assessed/performed               Past Medical History:  Diagnosis Date   Anxiety    Depression    Past Surgical History:  Procedure Laterality Date   NO PAST SURGERIES     Patient Active Problem List   Diagnosis Date Noted   Decreased vascular flow 06/16/2022   Lumbar radiculopathy 04/23/2022   History of gestational diabetes 06/20/2014   Depression, unspecified 10/11/2013    REFERRING DIAG: [id number].Merril Abbe (ICD-10-CM) - Fall, initial encounter Z3A.21 (ICD-10-CM) - [redacted] weeks gestation of pregnancy M54.31 (ICD-10-CM) - Sciatica of right side   THERAPY DIAG:  Sciatica, right side  Difficulty in walking, not elsewhere classified  Muscle weakness (generalized)  Other abnormalities of gait and mobility  Rationale for Evaluation and Treatment Rehabilitation  PERTINENT HISTORY: Pregnant with estimated due date of 01/11/2023   PRECAUTIONS: Other: Pregnancy   SUBJECTIVE:                                                                                                                                                                                      SUBJECTIVE STATEMENT: I missed being in the pool last week but my car broke down and I couldn't get here.   PAIN:  Are you having pain? No   OBJECTIVE: (objective measures completed at initial evaluation unless otherwise  dated)  DIAGNOSTIC FINDINGS:  N/A   PATIENT SURVEYS:  09/10/2022: LEFS 13 / 80 = 16.3 %   SCREENING FOR RED FLAGS: Bowel or bladder incontinence: No Spinal tumors: No Cauda equina syndrome: No Compression fracture: No Abdominal aneurysm: No   COGNITION: Overall cognitive status: Within functional limits for tasks assessed                          SENSATION: Reports "coldness" and numbness feeling below right knee   MUSCLE LENGTH: Hamstrings: tightness noted bilat     POSTURE: rounded  shoulders, forward head, and anterior pelvic tilt   PALPATION: Tenderness to palpation to bilateral lumbar paraspinals   LUMBAR ROM:    09/10/2022:  Limited secondary to pain and pregnancy     LOWER EXTREMITY MMT:     09/10/2022:  Bilateral hip strength of 4/5   FUNCTIONAL TESTS:  09/10/2022: 5 times sit to stand: 24.4 sec 3 minute walk test: 446 ft     TODAY'S TREATMENT:   09/30/22:Pt arrives for aquatic physical therapy. Treatment took place in 3.5-5.5 feet of water. Water temperature was 91 degrees F. Pt entered the pool via stairs, slowly with caution and use of hand rails. Pt requires buoyancy of water for support and to offload joints with strengthening exercises.  Seated water bench with 75% submersion Pt performed seated LE AROM exercises 20x in all planes while discussing current status. 75% depth water walking 10 lengths of each direction with small noodle for postural support. Post Pelvic tilt against the wall with half noodle lat/core press 10x. Standing hip 3 ways 15x slowly Bil added ankle fins for resistance today .Semi seated single buoy UE weights horizontal abd/add 20x..Hamstring stretch on second step 3x Bil 20 sec.Horizontal decompression float with 100% flotation and PTA providing lateral sway to mobilize trunk.   09/16/22:Pt arrives for aquatic physical therapy. Treatment took place in 3.5-5.5 feet of water. Water temperature was 91 degrees F. Pt entered the pool via  stairs, slowly with caution and use of hand rails. Pt requires buoyancy of water for support and to offload joints with strengthening exercises.  Seated water bench with 75% submersion Pt performed seated LE AROM exercises 20x in all planes while discussing current status. 75% depth water walking 8 lengths of each direction with small noodle for postural support. Post Pelvic tilt against the wall with half noodle lat/core press 10x. Standing hip 3 ways 15x slowly Bil added ankle fins for resistance today.Semi seated single buoy UE weights horizontal abd/add 20x.Hamstring stretch on second step 3x Bil 20 sec.Horizontal decompression float with 100% flotation and PTA providing lateral sway to mobilize trunk.                                                                                                                               09/11/22: Pt arrives for aquatic physical therapy. Treatment took place in 3.5-5.5 feet of water. Water temperature was 91 degrees F. Pt entered the pool via stairs, slowly with caution and use of hand rails. Pt requires buoyancy of water for support and to offload joints with strengthening exercises.  Seated water bench with 75% submersion Pt performed seated LE AROM exercises 20x in all planes, pt educated in water principles and how we use them. Pt verbally understood. 75% depth water walking 6 lengths of each direction with small noodle for postural support. Post Pelvic tilt against the wall with half noodle lat/core press 6x. Standing hip 3 ways 10x slowly Bil. Requires UE for balance.Hamstring  strtech on second step 2x Bil 20 sec.Horizontal decompression float with 100% flotation and PTA providing lateral sway to mobilize trunk.                                                                                                                                DATE: 09/10/2022  Issued HEP (see below)   Check all possible CPT codes: 97164 - PT Re-evaluation, 97110- Therapeutic  Exercise, [phone]- Neuro Re-education, [phone] - Gait Training, [phone] - Manual Therapy, [phone] - Therapeutic Activities, [phone] - Ultrasound, and [id number] - Aquatic therapy                         Check all conditions that are expected to impact treatment: Musculoskeletal disorders and Current pregnancy or recent postpartum             If treatment provided at initial evaluation, no treatment charged due to lack of authorization.                          PATIENT EDUCATION:  Education details: Issued HEP Person educated: Patient Education method: Explanation, Demonstration, and Handouts Education comprehension: verbalized understanding and returned demonstration   HOME EXERCISE PROGRAM: Access Code: [email or web address] URL: [email or web address] Date: 09/10/2022 Prepared by: Shelby Dubin Menke   Exercises - Seated Scapular Retraction  - 1 x daily - 7 x weekly - 2 sets - 10 reps - Seated Hamstring Stretch  - 1 x daily - 7 x weekly - 1 sets - 2 reps - 20 sec hold - Seated Posterior Pelvic Tilt  - 1 x daily - 7 x weekly - 2 sets - 10 reps - Seated Transversus Abdominis Bracing  - 1 x daily - 7 x weekly - 2 sets - 10 reps - Supine Posterior Pelvic Tilt  - 1 x daily - 7 x weekly - 2 sets - 10 reps   ASSESSMENT:   CLINICAL IMPRESSION:  Pt reports she doesn't get as tired after her water exercises. There is really no change in her RT leg feeling "dead." She did stumble walking on the sidewalk while talking on the phone this week. She reports she understands while walking on unlevel surfaces she needs to pay attention and not do something else. Pt does not have any negative effects throughout her RTLE when exercising in the water.   OBJECTIVE IMPAIRMENTS: decreased balance, difficulty walking, decreased strength, increased muscle spasms, impaired flexibility, postural dysfunction, and pain.    ACTIVITY LIMITATIONS: lifting, bending, sitting, and squatting   PARTICIPATION LIMITATIONS: cleaning,  laundry, and community activity   PERSONAL FACTORS: Time since onset of injury/illness/exacerbation and 1 comorbidity: Pregnancy with EDD of 01/11/23  are also affecting patient's functional outcome.    REHAB POTENTIAL: Good   CLINICAL DECISION MAKING: Evolving/moderate complexity   EVALUATION COMPLEXITY: Moderate     GOALS: Goals reviewed with patient? Yes  SHORT TERM GOALS: Target date: 09/25/2022   Patient will be independent with initial HEP. Baseline: Goal status: INITIAL   2.  Patient will report at least a 30% improvement in status since initial evaluation. Baseline:  Goal status: INITIAL     LONG TERM GOALS: Target date: 11/06/2022   Patient will be independent with advanced HEP and knowledgeable with self progression. Baseline:  Goal status: INITIAL   2.  Patient will increase Lower Extremity Functional Scale to at least 40% to allow her to perform more tasks around home with decreased pain. Baseline: 16.3% Goal status: INITIAL   3.  Patient will increase bilateral hip strength to at least 5-/5 to allow patient to perform functional tasks around home. Baseline:  Goal status: INITIAL   4.  Patient will report ability to go to the park or exercise at the [facility] without increased pain. Baseline:  Goal status: INITIAL     PLAN:   PT FREQUENCY: 2x/week   PT DURATION: 8 weeks   PLANNED INTERVENTIONS: Therapeutic exercises, Therapeutic activity, Neuromuscular re-education, Balance training, Gait training, Patient/Family education, Self Care, Joint mobilization, Joint manipulation, Aquatic Therapy, Dry Needling, Electrical stimulation, Spinal manipulation, Spinal mobilization, Cryotherapy, Moist heat, Taping, Ultrasound, Ionotophoresis 49m/ml Dexamethasone, Manual therapy, and Re-evaluation.   PLAN FOR NEXT SESSION: [place]  CSequim PTA 09/30/2022, 9:54 PM

## 2022-09-30 ENCOUNTER — Ambulatory Visit: Payer: Medicaid Other | Admitting: Physical Therapy

## 2022-09-30 ENCOUNTER — Encounter: Payer: Self-pay | Admitting: Physical Therapy

## 2022-09-30 DIAGNOSIS — M5431 Sciatica, right side: Secondary | ICD-10-CM

## 2022-09-30 DIAGNOSIS — M6281 Muscle weakness (generalized): Secondary | ICD-10-CM | POA: Diagnosis present

## 2022-09-30 DIAGNOSIS — R262 Difficulty in walking, not elsewhere classified: Secondary | ICD-10-CM

## 2022-09-30 DIAGNOSIS — R2689 Other abnormalities of gait and mobility: Secondary | ICD-10-CM

## 2022-10-01 ENCOUNTER — Ambulatory Visit: Payer: Medicaid Other | Admitting: Physical Therapy

## 2022-10-01 ENCOUNTER — Encounter: Payer: Self-pay | Admitting: Physical Therapy

## 2022-10-01 DIAGNOSIS — R2689 Other abnormalities of gait and mobility: Secondary | ICD-10-CM

## 2022-10-01 DIAGNOSIS — M6281 Muscle weakness (generalized): Secondary | ICD-10-CM

## 2022-10-01 DIAGNOSIS — M5431 Sciatica, right side: Secondary | ICD-10-CM

## 2022-10-01 DIAGNOSIS — R262 Difficulty in walking, not elsewhere classified: Secondary | ICD-10-CM

## 2022-10-01 NOTE — Therapy (Signed)
OUTPATIENT PHYSICAL THERAPY TREATMENT NOTE   Patient Name: Alexis Caldwell MRN: [id number] DOB:06-Nov-1985, 37 y.o., female Today's Date: 10/01/2022  PCP: Trey Sailors, [place]  REFERRING PROVIDER: Wende Mott, CNM   END OF SESSION:   PT End of Session - 10/01/22 1147     Visit Number 5    Date for PT Re-Evaluation 11/06/22    Authorization Type [employer]    Authorization Time Period [employer] approved 8 visits 09/10/2022-11/09/2022    Authorization - Visit Number 4    Authorization - Number of Visits 8    PT Start Time [id number]    PT Stop Time 1230    PT Time Calculation (min) 45 min    Activity Tolerance Patient tolerated treatment well    Behavior During Therapy [facility] for tasks assessed/performed                Past Medical History:  Diagnosis Date   Anxiety    Depression    Past Surgical History:  Procedure Laterality Date   NO PAST SURGERIES     Patient Active Problem List   Diagnosis Date Noted   Decreased vascular flow 06/16/2022   Lumbar radiculopathy 04/23/2022   History of gestational diabetes 06/20/2014   Depression, unspecified 10/11/2013    REFERRING DIAG: [id number] Abbe (ICD-10-CM) - Fall, initial encounter Z3A.21 (ICD-10-CM) - [redacted] weeks gestation of pregnancy M54.31 (ICD-10-CM) - Sciatica of right side   THERAPY DIAG:  Sciatica, right side  Difficulty in walking, not elsewhere classified  Muscle weakness (generalized)  Other abnormalities of gait and mobility  Rationale for Evaluation and Treatment Rehabilitation  PERTINENT HISTORY: Pregnant with estimated due date of 01/11/2023   PRECAUTIONS: Other: Pregnancy   SUBJECTIVE:                                                                                                                                                                                      SUBJECTIVE STATEMENT: I fell going out to my car to come here.  My hips gave out on me and I went down to my knees and hands.  I fell last  week too b/c my car broke down and I had to walk.  PAIN:  Are you having pain? 8/10, Rt>Lt hip   OBJECTIVE: (objective measures completed at initial evaluation unless otherwise dated)  DIAGNOSTIC FINDINGS:  N/A   PATIENT SURVEYS:  09/10/2022: LEFS 13 / 80 = 16.3 %   SCREENING FOR RED FLAGS: Bowel or bladder incontinence: No Spinal tumors: No Cauda equina syndrome: No Compression fracture: No Abdominal aneurysm: No   COGNITION: Overall cognitive status: Within functional limits for tasks assessed  SENSATION: Reports "coldness" and numbness feeling below right knee   MUSCLE LENGTH: Hamstrings: tightness noted bilat     POSTURE: rounded shoulders, forward head, and anterior pelvic tilt   PALPATION: Tenderness to palpation to bilateral lumbar paraspinals   LUMBAR ROM:    09/10/2022:  Limited secondary to pain and pregnancy     LOWER EXTREMITY MMT:     09/10/2022:  Bilateral hip strength of 4/5   FUNCTIONAL TESTS:  09/10/2022: 5 times sit to stand: 24.4 sec 3 minute walk test: 446 ft     TODAY'S TREATMENT:  10/01/22: PT attempted to start session from chair in ortho gym but Pt began to have increased anxiety and reported nausea, overheating, heart racing and became tearful In private room after shedding a layer, some time and having some water: Worked on box breathing for anxiety control: inhale 4, hold 4, exhale 4 Discussion around using some external support AD and maternity bracing  Trial of belly band with gait around ortho gym, added gait training with SPC - Pt reported feeling well supported and lighter with brace on Looked on [employer] together and gave print out of an option for a brace Updated HEP for seated LE therex and standing trunk weight shifts at counter in stagger stance PT walked with Pt out of car for safety given fall earlier today  09/30/22:Pt arrives for aquatic physical therapy. Treatment took place in 3.5-5.5  feet of water. Water temperature was 91 degrees F. Pt entered the pool via stairs, slowly with caution and use of hand rails. Pt requires buoyancy of water for support and to offload joints with strengthening exercises.  Seated water bench with 75% submersion Pt performed seated LE AROM exercises 20x in all planes while discussing current status. 75% depth water walking 10 lengths of each direction with small noodle for postural support. Post Pelvic tilt against the wall with half noodle lat/core press 10x. Standing hip 3 ways 15x slowly Bil added ankle fins for resistance today .Semi seated single buoy UE weights horizontal abd/add 20x..Hamstring stretch on second step 3x Bil 20 sec.Horizontal decompression float with 100% flotation and PTA providing lateral sway to mobilize trunk.   09/16/22:Pt arrives for aquatic physical therapy. Treatment took place in 3.5-5.5 feet of water. Water temperature was 91 degrees F. Pt entered the pool via stairs, slowly with caution and use of hand rails. Pt requires buoyancy of water for support and to offload joints with strengthening exercises.  Seated water bench with 75% submersion Pt performed seated LE AROM exercises 20x in all planes while discussing current status. 75% depth water walking 8 lengths of each direction with small noodle for postural support. Post Pelvic tilt against the wall with half noodle lat/core press 10x. Standing hip 3 ways 15x slowly Bil added ankle fins for resistance today.Semi seated single buoy UE weights horizontal abd/add 20x.Hamstring stretch on second step 3x Bil 20 sec.Horizontal decompression float with 100% flotation and PTA providing lateral sway to mobilize trunk.  Check all possible CPT  codes: [id number] - PT Re-evaluation, 97110- Therapeutic Exercise, [phone]- Neuro Re-education, [phone] - Gait Training, [phone] - Manual Therapy, [phone] - Therapeutic Activities, [phone] - Ultrasound, and [id number] - Aquatic therapy                         Check all conditions that are expected to impact treatment: Musculoskeletal disorders and Current pregnancy or recent postpartum             If treatment provided at initial evaluation, no treatment charged due to lack of authorization.                          PATIENT EDUCATION:  Education details: Issued HEP Person educated: Patient Education method: Explanation, Demonstration, and Handouts Education comprehension: verbalized understanding and returned demonstration   HOME EXERCISE PROGRAM: Access Code: [email or web address] URL: [email or web address] Date: 10/01/2022 Prepared by: Venetia Night Lurline Caver  Exercises - Seated Scapular Retraction  - 1 x daily - 7 x weekly - 2 sets - 10 reps - Seated Hamstring Stretch  - 1 x daily - 7 x weekly - 1 sets - 2 reps - 20 sec hold - Seated Posterior Pelvic Tilt  - 1 x daily - 7 x weekly - 2 sets - 10 reps - Seated Transversus Abdominis Bracing  - 1 x daily - 7 x weekly - 2 sets - 10 reps - Supine Posterior Pelvic Tilt  - 1 x daily - 7 x weekly - 2 sets - 10 reps - Seated March  - 1 x daily - 7 x weekly - 3 sets - 10 reps - Seated Long Arc Quad  - 1 x daily - 7 x weekly - 1 sets - 10 reps - 5 hold - Sit to Stand  - 3 x daily - 7 x weekly - 1 sets - 5 reps - Seated Hip Adduction Isometrics with Ball  - 1 x daily - 7 x weekly - 1 sets - 10 reps - 5 hold - Staggered Stance Forward Backward Weight Shift with Counter Support  - 1 x daily - 7 x weekly - 1 sets - 10 reps - 5 hold   ASSESSMENT:   CLINICAL IMPRESSION:  Pt arrived upset and with signs of beginnings of anxiety attack having fallen going to her car on her way here.  Session spent working on calming her nervous system with box breathing, trial of belly  band, and gait training with SPC as an option given falls.  Pt liked the feeling of the support from the belly band, reporting her trunk felt lighter and her legs felt better with the external support.  PT looked with Pt on [facility] website for some options and gave a handout.  Updated HEP for Pt to work on progressing to at home.    OBJECTIVE IMPAIRMENTS: decreased balance, difficulty walking, decreased strength, increased muscle spasms, impaired flexibility, postural dysfunction, and pain.    ACTIVITY LIMITATIONS: lifting, bending, sitting, and squatting   PARTICIPATION LIMITATIONS: cleaning, laundry, and community activity   PERSONAL FACTORS: Time since onset of injury/illness/exacerbation and 1 comorbidity: Pregnancy with EDD of 01/11/23  are also affecting patient's functional outcome.    REHAB POTENTIAL: Good   CLINICAL DECISION MAKING: Evolving/moderate complexity   EVALUATION COMPLEXITY: Moderate     GOALS: Goals reviewed with patient? Yes   SHORT TERM GOALS: Target date: 09/25/2022   Patient will be independent with initial  HEP. Baseline: Goal status: ongoing   2.  Patient will report at least a 30% improvement in status since initial evaluation. Baseline:  Goal status: INITIAL     LONG TERM GOALS: Target date: 11/06/2022   Patient will be independent with advanced HEP and knowledgeable with self progression. Baseline:  Goal status: INITIAL   2.  Patient will increase Lower Extremity Functional Scale to at least 40% to allow her to perform more tasks around home with decreased pain. Baseline: 16.3% Goal status: INITIAL   3.  Patient will increase bilateral hip strength to at least 5-/5 to allow patient to perform functional tasks around home. Baseline:  Goal status: INITIAL   4.  Patient will report ability to go to the park or exercise at the [facility] without increased pain. Baseline:  Goal status: INITIAL     PLAN:   PT FREQUENCY: 2x/week   PT DURATION: 8  weeks   PLANNED INTERVENTIONS: Therapeutic exercises, Therapeutic activity, Neuromuscular re-education, Balance training, Gait training, Patient/Family education, Self Care, Joint mobilization, Joint manipulation, Aquatic Therapy, Dry Needling, Electrical stimulation, Spinal manipulation, Spinal mobilization, Cryotherapy, Moist heat, Taping, Ultrasound, Ionotophoresis 69m/ml Dexamethasone, Manual therapy, and Re-evaluation.   PLAN FOR NEXT SESSION: f/u on maternity belt (did Pt purchase?), gait training with cane if Pt decided to get one, LE and trunk strength as tol, close supervision or CGA due to fall risk, blend of aquatic and land PT  [employer] PT 10/01/22 1:44 PM

## 2022-10-05 LAB — OB RESULTS CONSOLE HGB/HCT, BLOOD
HCT: 36 (ref 29–41)
Hemoglobin: 11.9

## 2022-10-05 LAB — OB RESULTS CONSOLE PLATELET COUNT: Platelets: 304

## 2022-10-05 LAB — OB RESULTS CONSOLE HIV ANTIBODY (ROUTINE TESTING): HIV: NONREACTIVE

## 2022-10-06 ENCOUNTER — Ambulatory Visit: Payer: Medicaid Other | Attending: Obstetrics and Gynecology

## 2022-10-06 ENCOUNTER — Ambulatory Visit: Payer: Medicaid Other

## 2022-10-06 ENCOUNTER — Other Ambulatory Visit: Payer: Self-pay

## 2022-10-06 VITALS — BP 123/64 | HR 102

## 2022-10-06 DIAGNOSIS — Z363 Encounter for antenatal screening for malformations: Secondary | ICD-10-CM

## 2022-10-06 DIAGNOSIS — O09522 Supervision of elderly multigravida, second trimester: Secondary | ICD-10-CM | POA: Diagnosis present

## 2022-10-06 DIAGNOSIS — O99213 Obesity complicating pregnancy, third trimester: Secondary | ICD-10-CM | POA: Diagnosis present

## 2022-10-06 DIAGNOSIS — Z362 Encounter for other antenatal screening follow-up: Secondary | ICD-10-CM

## 2022-10-06 DIAGNOSIS — O99212 Obesity complicating pregnancy, second trimester: Secondary | ICD-10-CM

## 2022-10-07 ENCOUNTER — Encounter: Payer: Self-pay | Admitting: Rehabilitative and Restorative Service Providers"

## 2022-10-07 ENCOUNTER — Ambulatory Visit: Payer: Medicaid Other | Admitting: Rehabilitative and Restorative Service Providers"

## 2022-10-07 DIAGNOSIS — R2689 Other abnormalities of gait and mobility: Secondary | ICD-10-CM

## 2022-10-07 DIAGNOSIS — M5431 Sciatica, right side: Secondary | ICD-10-CM | POA: Diagnosis not present

## 2022-10-07 DIAGNOSIS — R262 Difficulty in walking, not elsewhere classified: Secondary | ICD-10-CM

## 2022-10-07 DIAGNOSIS — M6281 Muscle weakness (generalized): Secondary | ICD-10-CM

## 2022-10-07 NOTE — Therapy (Signed)
OUTPATIENT PHYSICAL THERAPY TREATMENT NOTE   Patient Name: Alexis Caldwell MRN: [id number] DOB:Mar 10, 1986, 37 y.o., female Today's Date: 10/07/2022  PCP: Trey Sailors, [place]  REFERRING PROVIDER: Wende Mott, CNM   END OF SESSION:   PT End of Session - 10/07/22 1233     Visit Number 6    Date for PT Re-Evaluation 11/06/22    Authorization Type [employer]    Authorization Time Period [employer] approved 8 visits 09/10/2022-11/09/2022    Authorization - Visit Number 5    Authorization - Number of Visits 8    PT Start Time 1230    PT Stop Time 1310    PT Time Calculation (min) 40 min    Activity Tolerance Patient tolerated treatment well    Behavior During Therapy [facility] for tasks assessed/performed                Past Medical History:  Diagnosis Date   Anxiety    Depression    Past Surgical History:  Procedure Laterality Date   NO PAST SURGERIES     Patient Active Problem List   Diagnosis Date Noted   Decreased vascular flow 06/16/2022   Lumbar radiculopathy 04/23/2022   History of gestational diabetes 06/20/2014   Depression, unspecified 10/11/2013    REFERRING DIAG: [id number].Merril Abbe (ICD-10-CM) - Fall, initial encounter Z3A.21 (ICD-10-CM) - [redacted] weeks gestation of pregnancy M54.31 (ICD-10-CM) - Sciatica of right side   THERAPY DIAG:  Sciatica, right side  Difficulty in walking, not elsewhere classified  Muscle weakness (generalized)  Other abnormalities of gait and mobility  Rationale for Evaluation and Treatment Rehabilitation  PERTINENT HISTORY: Pregnant with estimated due date of 01/11/2023   PRECAUTIONS: Other: Pregnancy   SUBJECTIVE:                                                                                                                                                                                      SUBJECTIVE STATEMENT:  Pt reports overall feeling better than she did last visit.  States that she the maternity belt should arrive in  the next couple of days.  Patient reports at least 40% improvements since the initial evaluation.  PAIN:  Are you having pain? 6/10, Rt>Lt hip   OBJECTIVE: (objective measures completed at initial evaluation unless otherwise dated)  DIAGNOSTIC FINDINGS:  N/A   PATIENT SURVEYS:  09/10/2022: LEFS 13 / 80 = 16.3 %                  SENSATION: Reports "coldness" and numbness feeling below right knee   MUSCLE LENGTH: Hamstrings: tightness noted bilat     POSTURE: rounded shoulders, forward head, and  anterior pelvic tilt   PALPATION: Tenderness to palpation to bilateral lumbar paraspinals   LUMBAR ROM:    09/10/2022:  Limited secondary to pain and pregnancy     LOWER EXTREMITY MMT:     09/10/2022:  Bilateral hip strength of 4/5   FUNCTIONAL TESTS:  09/10/2022: 5 times sit to stand: 24.4 sec 3 minute walk test: 446 ft     TODAY'S TREATMENT:   10/07/2022: Nustep level 3 x8 min with PT present to discuss status Sitting on green pball performing 4 way pelvic tilts, pelvic CW circles and CCW circles.  X20 each Seated side bending across peanut ball x15 bilat Ambulation around PT gyms with [facility] and cuing to allow for proper sequencing and decreased reliance/leaning on SPC Supine hip adduction ball squeeze 2x10 Supine bent knee fall-outs 2x10 Hooklying posterior posterior pelvic tilts 2x10 Hooklying TA contraction with pushing hands through thighs 2x10 Supine hamstring stretch with strap 2x20 sec bilat   10/01/22: PT attempted to start session from chair in ortho gym but Pt began to have increased anxiety and reported nausea, overheating, heart racing and became tearful In private room after shedding a layer, some time and having some water: Worked on box breathing for anxiety control: inhale 4, hold 4, exhale 4 Discussion around using some external support AD and maternity bracing  Trial of belly band with gait around ortho gym, added gait training with SPC - Pt reported  feeling well supported and lighter with brace on Looked on [employer] together and gave print out of an option for a brace Updated HEP for seated LE therex and standing trunk weight shifts at counter in stagger stance PT walked with Pt out of car for safety given fall earlier today  09/30/22: Pt arrives for aquatic physical therapy. Treatment took place in 3.5-5.5 feet of water. Water temperature was 91 degrees F. Pt entered the pool via stairs, slowly with caution and use of hand rails. Pt requires buoyancy of water for support and to offload joints with strengthening exercises.  Seated water bench with 75% submersion Pt performed seated LE AROM exercises 20x in all planes while discussing current status. 75% depth water walking 10 lengths of each direction with small noodle for postural support. Post Pelvic tilt against the wall with half noodle lat/core press 10x. Standing hip 3 ways 15x slowly Bil added ankle fins for resistance today .Semi seated single buoy UE weights horizontal abd/add 20x..Hamstring stretch on second step 3x Bil 20 sec.Horizontal decompression float with 100% flotation and PTA providing lateral sway to mobilize trunk.       PATIENT EDUCATION:  Education details: Issued HEP Person educated: Patient Education method: Explanation, Demonstration, and Handouts Education comprehension: verbalized understanding and returned demonstration   HOME EXERCISE PROGRAM: Access Code: [email or web address] URL: [email or web address] Date: 10/01/2022 Prepared by: Venetia Night Beuhring  Exercises - Seated Scapular Retraction  - 1 x daily - 7 x weekly - 2 sets - 10 reps - Seated Hamstring Stretch  - 1 x daily - 7 x weekly - 1 sets - 2 reps - 20 sec hold - Seated Posterior Pelvic Tilt  - 1 x daily - 7 x weekly - 2 sets - 10 reps - Seated Transversus Abdominis Bracing  - 1 x daily - 7 x weekly - 2 sets - 10 reps - Supine Posterior Pelvic Tilt  - 1 x daily - 7 x weekly - 2 sets - 10  reps - Seated March  - 1 x daily - 7  x weekly - 3 sets - 10 reps - Seated Long Arc Quad  - 1 x daily - 7 x weekly - 1 sets - 10 reps - 5 hold - Sit to Stand  - 3 x daily - 7 x weekly - 1 sets - 5 reps - Seated Hip Adduction Isometrics with Ball  - 1 x daily - 7 x weekly - 1 sets - 10 reps - 5 hold - Staggered Stance Forward Backward Weight Shift with Counter Support  - 1 x daily - 7 x weekly - 1 sets - 10 reps - 5 hold   ASSESSMENT:   CLINICAL IMPRESSION:   Ms Vera presents to skilled PT reporting that she was feeling better today than last visit and has not had any new falls.  Patient hoping that her maternity band will arrive in the next couple of days.  Patient able to progress with strengthening and stretching exercises during session today.  States less pain with exercises and general overall improved mobility following exercises.  Patient able to ambulate with [facility] and reported that she did feel like it provided her with another level protection and stability with ambulation.  OBJECTIVE IMPAIRMENTS: decreased balance, difficulty walking, decreased strength, increased muscle spasms, impaired flexibility, postural dysfunction, and pain.    ACTIVITY LIMITATIONS: lifting, bending, sitting, and squatting   PARTICIPATION LIMITATIONS: cleaning, laundry, and community activity   PERSONAL FACTORS: Time since onset of injury/illness/exacerbation and 1 comorbidity: Pregnancy with EDD of 01/11/23  are also affecting patient's functional outcome.    REHAB POTENTIAL: Good   CLINICAL DECISION MAKING: Evolving/moderate complexity   EVALUATION COMPLEXITY: Moderate     GOALS: Goals reviewed with patient? Yes   SHORT TERM GOALS: Target date: 09/25/2022   Patient will be independent with initial HEP. Baseline: Goal status: MET on 10/07/2022   2.  Patient will report at least a 30% improvement in status since initial evaluation. Baseline:  Goal status: MET on 10/07/2022     LONG TERM GOALS:  Target date: 11/06/2022   Patient will be independent with advanced HEP and knowledgeable with self progression. Baseline:  Goal status: ONGOING   2.  Patient will increase Lower Extremity Functional Scale to at least 40% to allow her to perform more tasks around home with decreased pain. Baseline: 16.3% Goal status: INITIAL   3.  Patient will increase bilateral hip strength to at least 5-/5 to allow patient to perform functional tasks around home. Baseline:  Goal status: INITIAL   4.  Patient will report ability to go to the park or exercise at the [facility] without increased pain. Baseline:  Goal status: INITIAL     PLAN:   PT FREQUENCY: 2x/week   PT DURATION: 8 weeks   PLANNED INTERVENTIONS: Therapeutic exercises, Therapeutic activity, Neuromuscular re-education, Balance training, Gait training, Patient/Family education, Self Care, Joint mobilization, Joint manipulation, Aquatic Therapy, Dry Needling, Electrical stimulation, Spinal manipulation, Spinal mobilization, Cryotherapy, Moist heat, Taping, Ultrasound, Ionotophoresis 88m/ml Dexamethasone, Manual therapy, and Re-evaluation.   PLAN FOR NEXT SESSION: f/u on maternity belt (should arrive in the next couple of days), gait training with cane if Pt decided to get one, LE and trunk strength as tol, close supervision or CGA due to fall risk, blend of aquatic and land PT    [place] PT 10/07/22 1:25 PM   [place] [place] [place] [id number] # [phone] [phone]

## 2022-10-08 NOTE — Therapy (Signed)
OUTPATIENT PHYSICAL THERAPY TREATMENT NOTE   Patient Name: Alexis Caldwell MRN: [id number] DOB:Sep 03, 1985, 37 y.o., female Today's Date: 10/09/2022  PCP: Trey Sailors, [place]  REFERRING PROVIDER: Wende Mott, CNM   END OF SESSION:   PT End of Session - 10/09/22 1655     Visit Number 7    Date for PT Re-Evaluation 11/06/22    Authorization Type [employer]    Authorization - Visit Number 6    Authorization - Number of Visits 8    PT Start Time 1300    PT Stop Time 1345    PT Time Calculation (min) 45 min    Activity Tolerance Patient tolerated treatment well    Behavior During Therapy [facility] for tasks assessed/performed                 Past Medical History:  Diagnosis Date   Anxiety    Depression    Past Surgical History:  Procedure Laterality Date   NO PAST SURGERIES     Patient Active Problem List   Diagnosis Date Noted   Decreased vascular flow 06/16/2022   Lumbar radiculopathy 04/23/2022   History of gestational diabetes 06/20/2014   Depression, unspecified 10/11/2013    REFERRING DIAG: [id number].[email or web address] Abbe (ICD-10-CM) - Fall, initial encounter Z3A.21 (ICD-10-CM) - [redacted] weeks gestation of pregnancy M54.31 (ICD-10-CM) - Sciatica of right side   THERAPY DIAG:  Sciatica, right side  Difficulty in walking, not elsewhere classified  Muscle weakness (generalized)  Other abnormalities of gait and mobility  Rationale for Evaluation and Treatment Rehabilitation  PERTINENT HISTORY: Pregnant with estimated due date of 01/11/2023   PRECAUTIONS: Other: Pregnancy   SUBJECTIVE:                                                                                                                                                                                      SUBJECTIVE STATEMENT:  I need to manage my energy better and rest when I feel tired. Doing the Nustep helps me feel connected to my leg better. I currently have no pain.  PAIN:  Are you having pain? 6/10,  Rt>Lt hip   OBJECTIVE: (objective measures completed at initial evaluation unless otherwise dated)  DIAGNOSTIC FINDINGS:  N/A   PATIENT SURVEYS:  09/10/2022: LEFS 13 / 80 = 16.3 %                  SENSATION: Reports "coldness" and numbness feeling below right knee   MUSCLE LENGTH: Hamstrings: tightness noted bilat     POSTURE: rounded shoulders, forward head, and anterior pelvic tilt   PALPATION: Tenderness to palpation to bilateral lumbar paraspinals   LUMBAR ROM:  09/10/2022:  Limited secondary to pain and pregnancy     LOWER EXTREMITY MMT:     09/10/2022:  Bilateral hip strength of 4/5   FUNCTIONAL TESTS:  09/10/2022: 5 times sit to stand: 24.4 sec 3 minute walk test: 446 ft     TODAY'S TREATMENT  10/09/22:Pt arrives for aquatic physical therapy. Treatment took place in 3.5-5.5 feet of water. Water temperature was 91 degrees F. Pt entered the pool via stairs, slowly with caution and use of hand rails. Pt requires buoyancy of water for support and to offload joints with strengthening exercises.  Seated water bench with 75% submersion Pt performed seated LE AROM exercises 20x in all planes while discussing current status. 75% depth water walking 10 lengths of each direction with small noodle for postural support. Post Pelvic tilt against the wall with single buoy wts lat/core press 10x2. Standing hip 3 ways 15x slowly Bil added ankle fins for resistance today .Semi seated single buoy UE weights horizontal abd/add 20x..Hamstring stretch on second step 3x Bil 20 sec.Horizontal decompression float with 100% flotation and PTA providing lateral sway to mobilize trunk.    10/07/2022: Nustep level 3 x8 min with PT present to discuss status Sitting on green pball performing 4 way pelvic tilts, pelvic CW circles and CCW circles.  X20 each Seated side bending across peanut ball x15 bilat Ambulation around PT gyms with [facility] and cuing to allow for proper sequencing and decreased  reliance/leaning on SPC Supine hip adduction ball squeeze 2x10 Supine bent knee fall-outs 2x10 Hooklying posterior posterior pelvic tilts 2x10 Hooklying TA contraction with pushing hands through thighs 2x10 Supine hamstring stretch with strap 2x20 sec bilat   10/01/22: PT attempted to start session from chair in ortho gym but Pt began to have increased anxiety and reported nausea, overheating, heart racing and became tearful In private room after shedding a layer, some time and having some water: Worked on box breathing for anxiety control: inhale 4, hold 4, exhale 4 Discussion around using some external support AD and maternity bracing  Trial of belly band with gait around ortho gym, added gait training with SPC - Pt reported feeling well supported and lighter with brace on Looked on [employer] together and gave print out of an option for a brace Updated HEP for seated LE therex and standing trunk weight shifts at counter in stagger stance PT walked with Pt out of car for safety given fall earlier today       PATIENT EDUCATION:  Education details: Issued HEP Person educated: Patient Education method: Explanation, Demonstration, and Handouts Education comprehension: verbalized understanding and returned demonstration   HOME EXERCISE PROGRAM: Access Code: [email or web address] URL: [email or web address] Date: 10/01/2022 Prepared by: Venetia Night Beuhring  Exercises - Seated Scapular Retraction  - 1 x daily - 7 x weekly - 2 sets - 10 reps - Seated Hamstring Stretch  - 1 x daily - 7 x weekly - 1 sets - 2 reps - 20 sec hold - Seated Posterior Pelvic Tilt  - 1 x daily - 7 x weekly - 2 sets - 10 reps - Seated Transversus Abdominis Bracing  - 1 x daily - 7 x weekly - 2 sets - 10 reps - Supine Posterior Pelvic Tilt  - 1 x daily - 7 x weekly - 2 sets - 10 reps - Seated March  - 1 x daily - 7 x weekly - 3 sets - 10 reps - Seated Long Arc Quad  - 1 x daily - 7  x weekly - 1 sets - 10 reps  - 5 hold - Sit to Stand  - 3 x daily - 7 x weekly - 1 sets - 5 reps - Seated Hip Adduction Isometrics with Ball  - 1 x daily - 7 x weekly - 1 sets - 10 reps - 5 hold - Staggered Stance Forward Backward Weight Shift with Counter Support  - 1 x daily - 7 x weekly - 1 sets - 10 reps - 5 hold   ASSESSMENT:   CLINICAL IMPRESSION:  Pt arrives to aquatic PT with no pain and excited to be in the water as she feels "not pregnant" while in the water. Pt continues to report a feeling of numbness in her RT lower leg. She does not exhibit pain or LE giving way while in the pool.    OBJECTIVE IMPAIRMENTS: decreased balance, difficulty walking, decreased strength, increased muscle spasms, impaired flexibility, postural dysfunction, and pain.    ACTIVITY LIMITATIONS: lifting, bending, sitting, and squatting   PARTICIPATION LIMITATIONS: cleaning, laundry, and community activity   PERSONAL FACTORS: Time since onset of injury/illness/exacerbation and 1 comorbidity: Pregnancy with EDD of 01/11/23  are also affecting patient's functional outcome.    REHAB POTENTIAL: Good   CLINICAL DECISION MAKING: Evolving/moderate complexity   EVALUATION COMPLEXITY: Moderate     GOALS: Goals reviewed with patient? Yes   SHORT TERM GOALS: Target date: 09/25/2022   Patient will be independent with initial HEP. Baseline: Goal status: MET on 10/07/2022   2.  Patient will report at least a 30% improvement in status since initial evaluation. Baseline:  Goal status: MET on 10/07/2022     LONG TERM GOALS: Target date: 11/06/2022   Patient will be independent with advanced HEP and knowledgeable with self progression. Baseline:  Goal status: ONGOING   2.  Patient will increase Lower Extremity Functional Scale to at least 40% to allow her to perform more tasks around home with decreased pain. Baseline: 16.3% Goal status: INITIAL   3.  Patient will increase bilateral hip strength to at least 5-/5 to allow patient to  perform functional tasks around home. Baseline:  Goal status: INITIAL   4.  Patient will report ability to go to the park or exercise at the [facility] without increased pain. Baseline:  Goal status: INITIAL     PLAN:   PT FREQUENCY: 2x/week   PT DURATION: 8 weeks   PLANNED INTERVENTIONS: Therapeutic exercises, Therapeutic activity, Neuromuscular re-education, Balance training, Gait training, Patient/Family education, Self Care, Joint mobilization, Joint manipulation, Aquatic Therapy, Dry Needling, Electrical stimulation, Spinal manipulation, Spinal mobilization, Cryotherapy, Moist heat, Taping, Ultrasound, Ionotophoresis '4mg'$ /ml Dexamethasone, Manual therapy, and Re-evaluation.   PLAN FOR NEXT SESSION: f/u on maternity belt (should arrive in the next couple of days), gait training with cane if Pt decided to get one, LE and trunk strength as tol, close supervision or CGA due to fall risk, blend of aquatic and land PT    Myrene Galas, PTA 10/09/22 4:56 PM   [facility] [address], [place] [address] [place], [place] [address] Phone # [phone] Fax [phone]

## 2022-10-09 ENCOUNTER — Ambulatory Visit: Payer: Medicaid Other | Admitting: Physical Therapy

## 2022-10-09 ENCOUNTER — Encounter: Payer: Self-pay | Admitting: Physical Therapy

## 2022-10-09 DIAGNOSIS — M6281 Muscle weakness (generalized): Secondary | ICD-10-CM

## 2022-10-09 DIAGNOSIS — R262 Difficulty in walking, not elsewhere classified: Secondary | ICD-10-CM

## 2022-10-09 DIAGNOSIS — M5431 Sciatica, right side: Secondary | ICD-10-CM

## 2022-10-09 DIAGNOSIS — R2689 Other abnormalities of gait and mobility: Secondary | ICD-10-CM

## 2022-10-13 ENCOUNTER — Encounter: Payer: Self-pay | Admitting: Rehabilitative and Restorative Service Providers"

## 2022-10-13 ENCOUNTER — Ambulatory Visit: Payer: Medicaid Other | Admitting: Rehabilitative and Restorative Service Providers"

## 2022-10-13 DIAGNOSIS — M5431 Sciatica, right side: Secondary | ICD-10-CM

## 2022-10-13 DIAGNOSIS — R2689 Other abnormalities of gait and mobility: Secondary | ICD-10-CM

## 2022-10-13 DIAGNOSIS — R262 Difficulty in walking, not elsewhere classified: Secondary | ICD-10-CM

## 2022-10-13 DIAGNOSIS — M6281 Muscle weakness (generalized): Secondary | ICD-10-CM

## 2022-10-13 NOTE — Therapy (Signed)
OUTPATIENT PHYSICAL THERAPY TREATMENT NOTE   Patient Name: Alexis Caldwell MRN: [id number] DOB:25-Oct-1985, 37 y.o., female Today's Date: 10/13/2022  PCP: Trey Sailors, [place]  REFERRING PROVIDER: Wende Mott, CNM   END OF SESSION:   PT End of Session - 10/13/22 1111     Visit Number 8    Date for PT Re-Evaluation 11/06/22    Authorization Type [employer]    Authorization Time Period [facility] approved 8 visits 09/10/2022-11/09/2022    Authorization - Visit Number 7    Authorization - Number of Visits 8    PT Start Time 1107   Pt arrived 7 min late for appointment   PT Stop Time 1145    PT Time Calculation (min) 38 min    Activity Tolerance Patient tolerated treatment well    Behavior During Therapy [facility] for tasks assessed/performed                 Past Medical History:  Diagnosis Date   Anxiety    Depression    Past Surgical History:  Procedure Laterality Date   NO PAST SURGERIES     Patient Active Problem List   Diagnosis Date Noted   Decreased vascular flow 06/16/2022   Lumbar radiculopathy 04/23/2022   History of gestational diabetes 06/20/2014   Depression, unspecified 10/11/2013    REFERRING DIAG: [id number].Merril Abbe (ICD-10-CM) - Fall, initial encounter Z3A.21 (ICD-10-CM) - [redacted] weeks gestation of pregnancy M54.31 (ICD-10-CM) - Sciatica of right side   THERAPY DIAG:  Sciatica, right side  Difficulty in walking, not elsewhere classified  Muscle weakness (generalized)  Other abnormalities of gait and mobility  Rationale for Evaluation and Treatment Rehabilitation  PERTINENT HISTORY: Pregnant with estimated due date of 01/11/2023   PRECAUTIONS: Other: Pregnancy   SUBJECTIVE:                                                                                                                                                                                      SUBJECTIVE STATEMENT:  Pt reports that she got her maternity band, but needs assistance trying  to put it on.  States that she is having some increased pain due to the rain.  PAIN:  Are you having pain? 6-7/10, Rt>Lt hip   OBJECTIVE: (objective measures completed at initial evaluation unless otherwise dated)  DIAGNOSTIC FINDINGS:  N/A   PATIENT SURVEYS:  09/10/2022: LEFS 13 / 80 = 16.3 %  10/13/2022:  Lower Extremity Functional Score: 15 / 80 = 18.8 %                  SENSATION: Reports "coldness" and numbness feeling below right knee   MUSCLE  LENGTH: Hamstrings: tightness noted bilat     POSTURE: rounded shoulders, forward head, and anterior pelvic tilt   PALPATION: Tenderness to palpation to bilateral lumbar paraspinals   LUMBAR ROM:    09/10/2022:  Limited secondary to pain and pregnancy     LOWER EXTREMITY MMT:     09/10/2022:  Bilateral hip strength of 4/5   FUNCTIONAL TESTS:  09/10/2022: 5 times sit to stand: 24.4 sec 3 minute walk test: 446 ft  10/13/2022: 3 minute walk test:  402 ft     TODAY'S TREATMENT  10/13/2022: Nustep level 3 x8 min with PT present to discuss status Assistance with donning/doffing maternity belt Sitting on green pball performing 4 way pelvic tilts, pelvic CW circles and CCW circles.  X20 each 3 min ambulation with maternity belt on Supine bent knee fall-outs 2x10 Supine hamstring stretch with strap 2x20 sec bilat Seated side bending across peanut ball x15 bilat   10/09/22: Pt arrives for aquatic physical therapy. Treatment took place in 3.5-5.5 feet of water. Water temperature was 91 degrees F. Pt entered the pool via stairs, slowly with caution and use of hand rails. Pt requires buoyancy of water for support and to offload joints with strengthening exercises.  Seated water bench with 75% submersion Pt performed seated LE AROM exercises 20x in all planes while discussing current status. 75% depth water walking 10 lengths of each direction with small noodle for postural support. Post Pelvic tilt against the wall with single  buoy wts lat/core press 10x2. Standing hip 3 ways 15x slowly Bil added ankle fins for resistance today .Semi seated single buoy UE weights horizontal abd/add 20x..Hamstring stretch on second step 3x Bil 20 sec.Horizontal decompression float with 100% flotation and PTA providing lateral sway to mobilize trunk.    10/07/2022: Nustep level 3 x8 min with PT present to discuss status Sitting on green pball performing 4 way pelvic tilts, pelvic CW circles and CCW circles.  X20 each Seated side bending across peanut ball x15 bilat Ambulation around PT gyms with [facility] and cuing to allow for proper sequencing and decreased reliance/leaning on SPC Supine hip adduction ball squeeze 2x10 Supine bent knee fall-outs 2x10 Hooklying posterior posterior pelvic tilts 2x10 Hooklying TA contraction with pushing hands through thighs 2x10 Supine hamstring stretch with strap 2x20 sec bilat       PATIENT EDUCATION:  Education details: Issued HEP Person educated: Patient Education method: Explanation, Demonstration, and Handouts Education comprehension: verbalized understanding and returned demonstration   HOME EXERCISE PROGRAM: Access Code: [email or web address] URL: [email or web address] Date: 10/01/2022 Prepared by: Venetia Night Beuhring  Exercises - Seated Scapular Retraction  - 1 x daily - 7 x weekly - 2 sets - 10 reps - Seated Hamstring Stretch  - 1 x daily - 7 x weekly - 1 sets - 2 reps - 20 sec hold - Seated Posterior Pelvic Tilt  - 1 x daily - 7 x weekly - 2 sets - 10 reps - Seated Transversus Abdominis Bracing  - 1 x daily - 7 x weekly - 2 sets - 10 reps - Supine Posterior Pelvic Tilt  - 1 x daily - 7 x weekly - 2 sets - 10 reps - Seated March  - 1 x daily - 7 x weekly - 3 sets - 10 reps - Seated Long Arc Quad  - 1 x daily - 7 x weekly - 1 sets - 10 reps - 5 hold - Sit to Stand  - 3 x daily - 7 x weekly -  1 sets - 5 reps - Seated Hip Adduction Isometrics with Ball  - 1 x daily - 7 x weekly - 1 sets -  10 reps - 5 hold - Staggered Stance Forward Backward Weight Shift with Counter Support  - 1 x daily - 7 x weekly - 1 sets - 10 reps - 5 hold   ASSESSMENT:   CLINICAL IMPRESSION:   Ms Spehar presents to skilled PT reporting that she feels that she is having some increased pain today secondary to the rainy weather.  Patient reports fatigue and that may be why she had less distance in 3 minute walk test.  Patient has improved with her LEFS score slightly since initial evaluation.  Patient continues with some pain that limits her mobility and states that exercising in the pool has helped her with her pain.  Patient will assess response of her pain level to using maternity belt.     OBJECTIVE IMPAIRMENTS: decreased balance, difficulty walking, decreased strength, increased muscle spasms, impaired flexibility, postural dysfunction, and pain.    ACTIVITY LIMITATIONS: lifting, bending, sitting, and squatting   PARTICIPATION LIMITATIONS: cleaning, laundry, and community activity   PERSONAL FACTORS: Time since onset of injury/illness/exacerbation and 1 comorbidity: Pregnancy with EDD of 01/11/23  are also affecting patient's functional outcome.    REHAB POTENTIAL: Good   CLINICAL DECISION MAKING: Evolving/moderate complexity   EVALUATION COMPLEXITY: Moderate     GOALS: Goals reviewed with patient? Yes   SHORT TERM GOALS: Target date: 09/25/2022   Patient will be independent with initial HEP. Baseline: Goal status: MET on 10/07/2022   2.  Patient will report at least a 30% improvement in status since initial evaluation. Baseline:  Goal status: MET on 10/07/2022     LONG TERM GOALS: Target date: 11/06/2022   Patient will be independent with advanced HEP and knowledgeable with self progression. Baseline:  Goal status: ONGOING   2.  Patient will increase Lower Extremity Functional Scale to at least 40% to allow her to perform more tasks around home with decreased pain. Baseline: 16.3% Goal  status: ONGOING   3.  Patient will increase bilateral hip strength to at least 5-/5 to allow patient to perform functional tasks around home. Baseline:  Goal status: INITIAL   4.  Patient will report ability to go to the park or exercise at the [facility] without increased pain. Baseline:  Goal status: INITIAL     PLAN:   PT FREQUENCY: 2x/week   PT DURATION: 8 weeks   PLANNED INTERVENTIONS: Therapeutic exercises, Therapeutic activity, Neuromuscular re-education, Balance training, Gait training, Patient/Family education, Self Care, Joint mobilization, Joint manipulation, Aquatic Therapy, Dry Needling, Electrical stimulation, Spinal manipulation, Spinal mobilization, Cryotherapy, Moist heat, Taping, Ultrasound, Ionotophoresis '4mg'$ /ml Dexamethasone, Manual therapy, and Re-evaluation.   PLAN FOR NEXT SESSION: f/u on maternity belt, gait training with cane if Pt decided to get one, LE and trunk strength as tol, close supervision or CGA due to fall risk, blend of aquatic and land PT    [place], PT 10/13/22 12:07 PM   [facility] [address] [place], [place] [address] Phone # [phone] Fax [phone]

## 2022-10-14 ENCOUNTER — Ambulatory Visit: Payer: Medicaid Other | Admitting: Physical Therapy

## 2022-10-20 ENCOUNTER — Ambulatory Visit: Payer: Medicaid Other | Admitting: Rehabilitative and Restorative Service Providers"

## 2022-10-21 ENCOUNTER — Encounter: Payer: Medicaid Other | Attending: Obstetrics and Gynecology | Admitting: Registered"

## 2022-10-21 ENCOUNTER — Encounter: Payer: Self-pay | Admitting: Registered"

## 2022-10-21 DIAGNOSIS — Z713 Dietary counseling and surveillance: Secondary | ICD-10-CM | POA: Insufficient documentation

## 2022-10-21 DIAGNOSIS — Z3A00 Weeks of gestation of pregnancy not specified: Secondary | ICD-10-CM | POA: Insufficient documentation

## 2022-10-21 DIAGNOSIS — O24419 Gestational diabetes mellitus in pregnancy, unspecified control: Secondary | ICD-10-CM | POA: Insufficient documentation

## 2022-10-21 NOTE — Progress Notes (Signed)
The following learning objectives were met by the patient during this course:   States the definition of Gestational Diabetes States why dietary management is important in controlling blood glucose Describes the effects each nutrient has on blood glucose levels Demonstrates ability to create a balanced meal plan Demonstrates carbohydrate counting  States when to check blood glucose levels Demonstrates proper blood glucose monitoring techniques States the effect of stress and exercise on blood glucose levels States the importance of limiting caffeine and abstaining from alcohol and smoking   Blood glucose monitor given: Accu-chek Guide Me Lot [id number] Exp: 2023-06-29 CBG: 117 mg/dL  Patient instructed to monitor glucose levels: FBS: 60 - <95; 1 hour: <140; 2 hour: <120   Patient received handouts: Nutrition Diabetes and Pregnancy, including carb counting list and glucose log sheet   Patient will be seen for follow-up as needed.

## 2022-10-23 ENCOUNTER — Ambulatory Visit: Payer: Medicaid Other | Admitting: Physical Therapy

## 2022-10-23 ENCOUNTER — Encounter: Payer: Self-pay | Admitting: Physical Therapy

## 2022-10-23 DIAGNOSIS — M6281 Muscle weakness (generalized): Secondary | ICD-10-CM | POA: Diagnosis present

## 2022-10-23 DIAGNOSIS — R2689 Other abnormalities of gait and mobility: Secondary | ICD-10-CM | POA: Diagnosis present

## 2022-10-23 DIAGNOSIS — R262 Difficulty in walking, not elsewhere classified: Secondary | ICD-10-CM | POA: Diagnosis present

## 2022-10-23 DIAGNOSIS — M5431 Sciatica, right side: Secondary | ICD-10-CM | POA: Diagnosis present

## 2022-10-23 NOTE — Therapy (Signed)
OUTPATIENT PHYSICAL THERAPY TREATMENT NOTE   Patient Name: Alexis Caldwell MRN: [id number] DOB:1986-01-01, 37 y.o., female Today's Date: 10/23/2022  PCP: Trey Sailors, PA  REFERRING PROVIDER: Wende Mott, CNM   END OF SESSION:   PT End of Session - 10/23/22 1205     Visit Number 9    Date for PT Re-Evaluation 11/06/22    Authorization Type [employer]    Authorization Time Period [employer] approved 8 visits 09/10/2022-11/09/2022    Authorization - Visit Number 8    Authorization - Number of Visits 8    PT Start Time 1202    PT Stop Time 1250    PT Time Calculation (min) 48 min    Activity Tolerance Patient tolerated treatment well    Behavior During Therapy [facility] for tasks assessed/performed                  Past Medical History:  Diagnosis Date   Anxiety    Depression    Past Surgical History:  Procedure Laterality Date   NO PAST SURGERIES     Patient Active Problem List   Diagnosis Date Noted   Gestational diabetes mellitus (GDM), antepartum 10/21/2022   Decreased vascular flow 06/16/2022   Lumbar radiculopathy 04/23/2022   History of gestational diabetes 06/20/2014   Depression, unspecified 10/11/2013    REFERRING DIAG: [id number].[email or web address] Abbe (ICD-10-CM) - Fall, initial encounter Z3A.21 (ICD-10-CM) - [redacted] weeks gestation of pregnancy M54.31 (ICD-10-CM) - Sciatica of right side   THERAPY DIAG:  Sciatica, right side  Difficulty in walking, not elsewhere classified  Muscle weakness (generalized)  Other abnormalities of gait and mobility  Rationale for Evaluation and Treatment Rehabilitation  PERTINENT HISTORY: Pregnant with estimated due date of 01/11/2023   PRECAUTIONS: Other: Pregnancy   SUBJECTIVE:                                                                                                                                                                                      SUBJECTIVE STATEMENT: I always feel stressed. The pool is my refuge and I  feel best there. I have good days and bad days.    PAIN:  Are you having pain? Currently no pain.   OBJECTIVE: (objective measures completed at initial evaluation unless otherwise dated)  DIAGNOSTIC FINDINGS:  N/A   PATIENT SURVEYS:  09/10/2022: LEFS 13 / 80 = 16.3 %  10/13/2022:  Lower Extremity Functional Score: 15 / 80 = 18.8 %                  SENSATION: Reports "coldness" and numbness feeling below right knee   MUSCLE LENGTH: Hamstrings: tightness noted bilat  POSTURE: rounded shoulders, forward head, and anterior pelvic tilt   PALPATION: Tenderness to palpation to bilateral lumbar paraspinals   LUMBAR ROM:    09/10/2022:  Limited secondary to pain and pregnancy     LOWER EXTREMITY MMT:     09/10/2022:  Bilateral hip strength of 4/5   FUNCTIONAL TESTS:  09/10/2022: 5 times sit to stand: 24.4 sec 3 minute walk test: 446 ft  10/13/2022: 3 minute walk test:  402 ft     TODAY'S TREATMENT  10/23/22: Pt arrives for aquatic physical therapy. Treatment took place in 3.5-5.5 feet of water. Water temperature was 91 degrees F. Pt entered the pool via stairs, slowly with caution and use of hand rails. Pt requires buoyancy of water for support and to offload joints with strengthening exercises.  Seated water bench with 75% submersion Pt performed seated LE AROM exercises 20x in all planes while discussing current status. 75% depth water walking 10 lengths of each direction with small noodle for postural support. Post Pelvic tilt against the wall with single buoy wts lat/core press 10x2. Standing hip 3 ways 15x slowly Bil added ankle fins for resistance today .Semi seated single buoy UE weights horizontal abd/add 20x..Hamstring stretch on second step 3x Bil 20 sec.Horizontal decompression float with 100% flotation.  10/13/2022: Nustep level 3 x8 min with PT present to discuss status Assistance with donning/doffing maternity belt Sitting on green pball performing 4 way  pelvic tilts, pelvic CW circles and CCW circles.  X20 each 3 min ambulation with maternity belt on Supine bent knee fall-outs 2x10 Supine hamstring stretch with strap 2x20 sec bilat Seated side bending across peanut ball x15 bilat   10/09/22: Pt arrives for aquatic physical therapy. Treatment took place in 3.5-5.5 feet of water. Water temperature was 91 degrees F. Pt entered the pool via stairs, slowly with caution and use of hand rails. Pt requires buoyancy of water for support and to offload joints with strengthening exercises.  Seated water bench with 75% submersion Pt performed seated LE AROM exercises 20x in all planes while discussing current status. 75% depth water walking 10 lengths of each direction with small noodle for postural support. Post Pelvic tilt against the wall with single buoy wts lat/core press 10x2. Standing hip 3 ways 15x slowly Bil added ankle fins for resistance today .Semi seated single buoy UE weights horizontal abd/add 20x..Hamstring stretch on second step 3x Bil 20 sec.Horizontal decompression float with 100% flotation and PTA providing lateral sway to mobilize trunk.      PATIENT EDUCATION:  Education details: Issued HEP Person educated: Patient Education method: Explanation, Demonstration, and Handouts Education comprehension: verbalized understanding and returned demonstration   HOME EXERCISE PROGRAM: Access Code: [email or web address] URL: [email or web address] Date: 10/01/2022 Prepared by: Venetia Night Beuhring  Exercises - Seated Scapular Retraction  - 1 x daily - 7 x weekly - 2 sets - 10 reps - Seated Hamstring Stretch  - 1 x daily - 7 x weekly - 1 sets - 2 reps - 20 sec hold - Seated Posterior Pelvic Tilt  - 1 x daily - 7 x weekly - 2 sets - 10 reps - Seated Transversus Abdominis Bracing  - 1 x daily - 7 x weekly - 2 sets - 10 reps - Supine Posterior Pelvic Tilt  - 1 x daily - 7 x weekly - 2 sets - 10 reps - Seated March  - 1 x daily - 7 x weekly - 3  sets - 10 reps - Seated Long Arc Quad  -  1 x daily - 7 x weekly - 1 sets - 10 reps - 5 hold - Sit to Stand  - 3 x daily - 7 x weekly - 1 sets - 5 reps - Seated Hip Adduction Isometrics with Ball  - 1 x daily - 7 x weekly - 1 sets - 10 reps - 5 hold - Staggered Stance Forward Backward Weight Shift with Counter Support  - 1 x daily - 7 x weekly - 1 sets - 10 reps - 5 hold   ASSESSMENT:   CLINICAL IMPRESSION:  Pt arrives with no pain but reports having good days and bad days.Pt feels there is a relationship between her stress and pain and feels excellent when she is exercising in the water.   OBJECTIVE IMPAIRMENTS: decreased balance, difficulty walking, decreased strength, increased muscle spasms, impaired flexibility, postural dysfunction, and pain.    ACTIVITY LIMITATIONS: lifting, bending, sitting, and squatting   PARTICIPATION LIMITATIONS: cleaning, laundry, and community activity   PERSONAL FACTORS: Time since onset of injury/illness/exacerbation and 1 comorbidity: Pregnancy with EDD of 01/11/23  are also affecting patient's functional outcome.    REHAB POTENTIAL: Good   CLINICAL DECISION MAKING: Evolving/moderate complexity   EVALUATION COMPLEXITY: Moderate     GOALS: Goals reviewed with patient? Yes   SHORT TERM GOALS: Target date: 09/25/2022   Patient will be independent with initial HEP. Baseline: Goal status: MET on 10/07/2022   2.  Patient will report at least a 30% improvement in status since initial evaluation. Baseline:  Goal status: MET on 10/07/2022     LONG TERM GOALS: Target date: 11/06/2022   Patient will be independent with advanced HEP and knowledgeable with self progression. Baseline:  Goal status: ONGOING   2.  Patient will increase Lower Extremity Functional Scale to at least 40% to allow her to perform more tasks around home with decreased pain. Baseline: 16.3% Goal status: ONGOING   3.  Patient will increase bilateral hip strength to at least 5-/5 to  allow patient to perform functional tasks around home. Baseline:  Goal status: INITIAL   4.  Patient will report ability to go to the park or exercise at the [facility] without increased pain. Baseline:  Goal status: INITIAL     PLAN:   PT FREQUENCY: 2x/week   PT DURATION: 8 weeks   PLANNED INTERVENTIONS: Therapeutic exercises, Therapeutic activity, Neuromuscular re-education, Balance training, Gait training, Patient/Family education, Self Care, Joint mobilization, Joint manipulation, Aquatic Therapy, Dry Needling, Electrical stimulation, Spinal manipulation, Spinal mobilization, Cryotherapy, Moist heat, Taping, Ultrasound, Ionotophoresis '4mg'$ /ml Dexamethasone, Manual therapy, and Re-evaluation.   PLAN FOR NEXT SESSION: f/u on maternity belt, gait training with cane if Pt decided to get one, LE and trunk strength as tol, close supervision or CGA due to fall risk, blend of aquatic and land PT    Myrene Galas, PTA 10/23/22 4:53 PM   [facility] [address], [address] [place] [place], [place] [address] Phone # [phone] Fax [phone]

## 2022-10-27 ENCOUNTER — Encounter: Payer: Self-pay | Admitting: Rehabilitative and Restorative Service Providers"

## 2022-10-27 ENCOUNTER — Ambulatory Visit: Payer: Medicaid Other | Admitting: Rehabilitative and Restorative Service Providers"

## 2022-10-27 DIAGNOSIS — R262 Difficulty in walking, not elsewhere classified: Secondary | ICD-10-CM

## 2022-10-27 DIAGNOSIS — M5431 Sciatica, right side: Secondary | ICD-10-CM

## 2022-10-27 DIAGNOSIS — R2689 Other abnormalities of gait and mobility: Secondary | ICD-10-CM

## 2022-10-27 DIAGNOSIS — M6281 Muscle weakness (generalized): Secondary | ICD-10-CM

## 2022-10-27 NOTE — Therapy (Signed)
OUTPATIENT PHYSICAL THERAPY TREATMENT NOTE   Patient Name: Alexis Caldwell MRN: [id number] DOB:[birth year], 37 y.o., female Today's Date: 10/27/2022  PCP: Alexis Sailors, PA  REFERRING PROVIDER: Wende Caldwell, CNM   END OF SESSION:   PT End of Session - 10/27/22 1232     Visit Number 10    Date for PT Re-Evaluation 11/06/22    Authorization Type [employer]    Authorization Time Period [employer] approved 8 + 4 visits 09/10/2022-11/09/2022 (12 total)    Authorization - Visit Number 9    Authorization - Number of Visits 12    PT Start Time 1230    PT Stop Time 1310    PT Time Calculation (min) 40 min    Activity Tolerance Patient limited by pain    Behavior During Therapy [facility] for tasks assessed/performed                  Past Medical History:  Diagnosis Date   Anxiety    Depression    Past Surgical History:  Procedure Laterality Date   NO PAST SURGERIES     Patient Active Problem List   Diagnosis Date Noted   Gestational diabetes mellitus (GDM), antepartum 10/21/2022   Decreased vascular flow 06/16/2022   Lumbar radiculopathy 04/23/2022   History of gestational diabetes 06/20/2014   Depression, unspecified 10/11/2013    REFERRING DIAG: [id number].Alexis Caldwell (ICD-10-CM) - Fall, initial encounter Z3A.21 (ICD-10-CM) - [redacted] weeks gestation of pregnancy M54.31 (ICD-10-CM) - Sciatica of right side   THERAPY DIAG:  Sciatica, right side  Difficulty in walking, not elsewhere classified  Muscle weakness (generalized)  Other abnormalities of gait and mobility  Rationale for Evaluation and Treatment Rehabilitation  PERTINENT HISTORY: Pregnant with estimated due date of 01/11/2023   PRECAUTIONS: Other: Pregnancy   SUBJECTIVE:                                                                                                                                                                                      SUBJECTIVE STATEMENT:  Pt reports that she cleaned yesterday,  so she is having increased pain.   PAIN:  Are you having pain? Yes: NPRS scale: 9/10 Pain location: back and down right leg Pain description: irritating, sharp Aggravating factors: sitting for prolonged periods Relieving factors: movement   OBJECTIVE: (objective measures completed at initial evaluation unless otherwise dated)  DIAGNOSTIC FINDINGS:  N/A   PATIENT SURVEYS:  09/10/2022: LEFS 13 / 80 = 16.3 %  10/13/2022:  Lower Extremity Functional Score: 15 / 80 = 18.8 %                  SENSATION: Reports "  coldness" and numbness feeling below right knee   MUSCLE LENGTH: Hamstrings: tightness noted bilat     POSTURE: rounded shoulders, forward head, and anterior pelvic tilt   PALPATION: Tenderness to palpation to bilateral lumbar paraspinals   LUMBAR ROM:    09/10/2022:  Limited secondary to pain and pregnancy     LOWER EXTREMITY MMT:     09/10/2022:  Bilateral hip strength of 4/5   FUNCTIONAL TESTS:  09/10/2022: 5 times sit to stand: 24.4 sec 3 minute walk test: 446 ft  10/13/2022: 3 minute walk test:  402 ft     TODAY'S TREATMENT  10/27/2022: Nustep level 3 x6 min with PT present to discuss status Sitting on green pball performing 4 way pelvic tilts, pelvic CW circles and CCW circles.  2X20 each Sidelying with blue foam roll under top leg:  open books 2x10 bilat Supine posterior pelvic tilts 2x10 Supine bent knee fall-outs 2x10 Supine TA contraction with hands pressing into thighs 2x10 Supine hamstring stretch with strap 2x20 sec bilat   10/23/22: Pt arrives for aquatic physical therapy. Treatment took place in 3.5-5.5 feet of water. Water temperature was 91 degrees F. Pt entered the pool via stairs, slowly with caution and use of hand rails. Pt requires buoyancy of water for support and to offload joints with strengthening exercises.  Seated water bench with 75% submersion Pt performed seated LE AROM exercises 20x in all planes while discussing current  status. 75% depth water walking 10 lengths of each direction with small noodle for postural support. Post Pelvic tilt against the wall with single buoy wts lat/core press 10x2. Standing hip 3 ways 15x slowly Bil added ankle fins for resistance today .Semi seated single buoy UE weights horizontal abd/add 20x..Hamstring stretch on second step 3x Bil 20 sec.Horizontal decompression float with 100% flotation.  10/13/2022: Nustep level 3 x8 min with PT present to discuss status Assistance with donning/doffing maternity belt Sitting on green pball performing 4 way pelvic tilts, pelvic CW circles and CCW circles.  X20 each 3 min ambulation with maternity belt on Supine bent knee fall-outs 2x10 Supine hamstring stretch with strap 2x20 sec bilat Seated side bending across peanut ball x15 bilat      PATIENT EDUCATION:  Education details: Issued HEP Person educated: Patient Education method: Explanation, Demonstration, and Handouts Education comprehension: verbalized understanding and returned demonstration   HOME EXERCISE PROGRAM: Access Code: [email or web address] URL: [email or web address] Date: 10/01/2022 Prepared by: Alexis Caldwell  Exercises - Seated Scapular Retraction  - 1 x daily - 7 x weekly - 2 sets - 10 reps - Seated Hamstring Stretch  - 1 x daily - 7 x weekly - 1 sets - 2 reps - 20 sec hold - Seated Posterior Pelvic Tilt  - 1 x daily - 7 x weekly - 2 sets - 10 reps - Seated Transversus Abdominis Bracing  - 1 x daily - 7 x weekly - 2 sets - 10 reps - Supine Posterior Pelvic Tilt  - 1 x daily - 7 x weekly - 2 sets - 10 reps - Seated March  - 1 x daily - 7 x weekly - 3 sets - 10 reps - Seated Long Arc Quad  - 1 x daily - 7 x weekly - 1 sets - 10 reps - 5 hold - Sit to Stand  - 3 x daily - 7 x weekly - 1 sets - 5 reps - Seated Hip Adduction Isometrics with Ball  - 1 x daily - 7 x weekly -  1 sets - 10 reps - 5 hold - Staggered Stance Forward Backward Weight Shift with Counter  Support  - 1 x daily - 7 x weekly - 1 sets - 10 reps - 5 hold   ASSESSMENT:   CLINICAL IMPRESSION:   Alexis Caldwell presents to skilled PT reporting increased pain, but she feels that it is due to her cleaning yesterday.  Patient reported decreased pain following pelvic tilts on physioball.  Patient continues to have difficulty with hamstring stretching and requires cuing for technique.  Patient continues to require skilled PT to progress towards goal related activities.  OBJECTIVE IMPAIRMENTS: decreased balance, difficulty walking, decreased strength, increased muscle spasms, impaired flexibility, postural dysfunction, and pain.    ACTIVITY LIMITATIONS: lifting, bending, sitting, and squatting   PARTICIPATION LIMITATIONS: cleaning, laundry, and community activity   PERSONAL FACTORS: Time since onset of injury/illness/exacerbation and 1 comorbidity: Pregnancy with EDD of 01/11/23  are also affecting patient's functional outcome.    REHAB POTENTIAL: Good   CLINICAL DECISION MAKING: Evolving/moderate complexity   EVALUATION COMPLEXITY: Moderate     GOALS: Goals reviewed with patient? Yes   SHORT TERM GOALS: Target date: 09/25/2022   Patient will be independent with initial HEP. Baseline: Goal status: MET on 10/07/2022   2.  Patient will report at least a 30% improvement in status since initial evaluation. Baseline:  Goal status: MET on 10/07/2022     LONG TERM GOALS: Target date: 11/06/2022   Patient will be independent with advanced HEP and knowledgeable with self progression. Baseline:  Goal status: ONGOING   2.  Patient will increase Lower Extremity Functional Scale to at least 40% to allow her to perform more tasks around home with decreased pain. Baseline: 16.3% Goal status: ONGOING   3.  Patient will increase bilateral hip strength to at least 5-/5 to allow patient to perform functional tasks around home. Baseline:  Goal status: INITIAL   4.  Patient will report ability to  go to the park or exercise at the [facility] without increased pain. Baseline:  Goal status: INITIAL     PLAN:   PT FREQUENCY: 2x/week   PT DURATION: 8 weeks   PLANNED INTERVENTIONS: Therapeutic exercises, Therapeutic activity, Neuromuscular re-education, Balance training, Gait training, Patient/Family education, Self Care, Joint mobilization, Joint manipulation, Aquatic Therapy, Dry Needling, Electrical stimulation, Spinal manipulation, Spinal mobilization, Cryotherapy, Moist heat, Taping, Ultrasound, Ionotophoresis '4mg'$ /ml Dexamethasone, Manual therapy, and Re-evaluation.   PLAN FOR NEXT SESSION: f/u on maternity belt, LE and trunk strength as tol, close supervision or CGA due to fall risk, blend of aquatic and land PT    [place], PT 10/27/22 1:25 PM   [facility] [address], [place], [place] [address] Phone # [phone] Fax [phone]

## 2022-10-30 ENCOUNTER — Encounter: Payer: Self-pay | Admitting: Physical Therapy

## 2022-10-30 ENCOUNTER — Ambulatory Visit: Payer: Medicaid Other | Admitting: Physical Therapy

## 2022-10-30 DIAGNOSIS — M6281 Muscle weakness (generalized): Secondary | ICD-10-CM

## 2022-10-30 DIAGNOSIS — R262 Difficulty in walking, not elsewhere classified: Secondary | ICD-10-CM

## 2022-10-30 DIAGNOSIS — M5431 Sciatica, right side: Secondary | ICD-10-CM

## 2022-10-30 DIAGNOSIS — R2689 Other abnormalities of gait and mobility: Secondary | ICD-10-CM

## 2022-10-30 NOTE — Therapy (Signed)
OUTPATIENT PHYSICAL THERAPY TREATMENT NOTE   Patient Name: Alexis Caldwell MRN: [id number] DOB:[birth year], 37 y.o., female Today's Date: 10/30/2022  PCP: Trey Sailors, PA  REFERRING PROVIDER: Wende Mott, CNM   END OF SESSION:   PT End of Session - 10/30/22 1210     Visit Number 11    Date for PT Re-Evaluation 11/06/22    Authorization Type [employer]    Authorization Time Period [employer] approved 8 + 4 visits 09/10/2022-11/09/2022 (12 total)    Authorization - Visit Number 10    Authorization - Number of Visits 12    PT Start Time 1210    PT Stop Time 1300    PT Time Calculation (min) 50 min    Activity Tolerance Patient tolerated treatment well    Behavior During Therapy [facility] for tasks assessed/performed                   Past Medical History:  Diagnosis Date   Anxiety    Depression    Past Surgical History:  Procedure Laterality Date   NO PAST SURGERIES     Patient Active Problem List   Diagnosis Date Noted   Gestational diabetes mellitus (GDM), antepartum 10/21/2022   Decreased vascular flow 06/16/2022   Lumbar radiculopathy 04/23/2022   History of gestational diabetes 06/20/2014   Depression, unspecified 10/11/2013    REFERRING DIAG: [id number].[email or web address] Abbe (ICD-10-CM) - Fall, initial encounter Z3A.21 (ICD-10-CM) - [redacted] weeks gestation of pregnancy M54.31 (ICD-10-CM) - Sciatica of right side   THERAPY DIAG:  Sciatica, right side  Difficulty in walking, not elsewhere classified  Muscle weakness (generalized)  Other abnormalities of gait and mobility  Rationale for Evaluation and Treatment Rehabilitation  PERTINENT HISTORY: Pregnant with estimated due date of 01/11/2023   PRECAUTIONS: Other: Pregnancy   SUBJECTIVE:                                                                                                                                                                                      SUBJECTIVE STATEMENT: Better day, no pain.      PAIN:  Are you having pain? No: NPRS scale: 0/10 Pain location: Pain description:  Aggravating factors: sitting for prolonged periods Relieving factors: movement   OBJECTIVE: (objective measures completed at initial evaluation unless otherwise dated)  DIAGNOSTIC FINDINGS:  N/A   PATIENT SURVEYS:  09/10/2022: LEFS 13 / 80 = 16.3 %  10/13/2022:  Lower Extremity Functional Score: 15 / 80 = 18.8 %                  SENSATION: Reports "coldness" and numbness feeling below right knee   MUSCLE LENGTH: Hamstrings:  tightness noted bilat     POSTURE: rounded shoulders, forward head, and anterior pelvic tilt   PALPATION: Tenderness to palpation to bilateral lumbar paraspinals   LUMBAR ROM:    09/10/2022:  Limited secondary to pain and pregnancy     LOWER EXTREMITY MMT:     09/10/2022:  Bilateral hip strength of 4/5   FUNCTIONAL TESTS:  09/10/2022: 5 times sit to stand: 24.4 sec 3 minute walk test: 446 ft  10/13/2022: 3 minute walk test:  402 ft     TODAY'S TREATMENT  10/30/22:Pt arrives for aquatic physical therapy. Treatment took place in 3.5-5.5 feet of water. Water temperature was 90 degrees F. Pt entered the pool via stairs, slowly with caution and use of hand rails. Pt requires buoyancy of water for support and to offload joints with strengthening exercises.  Seated water bench with 75% submersion Pt performed seated LE AROM exercises 20x in all planes while discussing current status. 75% depth water walking 10 lengths of each direction with UE water wts for push pull. Post Pelvic tilt against the wall with single buoy wts lat/core press 10x3. Standing hip 3 ways 20x slowly Bil added ankle fins .Semi seated single buoy UE weights horizontal abd/add 20x..Hamstring stretch on second step 3x Bil 20 sec.Horizontal decompression float with 100% flotation.  10/27/2022: Nustep level 3 x6 min with PT present to discuss status Sitting on green pball performing 4 way pelvic  tilts, pelvic CW circles and CCW circles.  2X20 each Sidelying with blue foam roll under top leg:  open books 2x10 bilat Supine posterior pelvic tilts 2x10 Supine bent knee fall-outs 2x10 Supine TA contraction with hands pressing into thighs 2x10 Supine hamstring stretch with strap 2x20 sec bilat   10/23/22: Pt arrives for aquatic physical therapy. Treatment took place in 3.5-5.5 feet of water. Water temperature was 91 degrees F. Pt entered the pool via stairs, slowly with caution and use of hand rails. Pt requires buoyancy of water for support and to offload joints with strengthening exercises.  Seated water bench with 75% submersion Pt performed seated LE AROM exercises 20x in all planes while discussing current status. 75% depth water walking 10 lengths of each direction with small noodle for postural support. Post Pelvic tilt against the wall with single buoy wts lat/core press 10x2. Standing hip 3 ways 15x slowly Bil added ankle fins for resistance today .Semi seated single buoy UE weights horizontal abd/add 20x..Hamstring stretch on second step 3x Bil 20 sec.Horizontal decompression float with 100% flotation.     PATIENT EDUCATION:  Education details: Issued HEP Person educated: Patient Education method: Explanation, Demonstration, and Handouts Education comprehension: verbalized understanding and returned demonstration   HOME EXERCISE PROGRAM: Access Code: [email or web address] URL: [email or web address] Date: 10/01/2022 Prepared by: Venetia Night Beuhring  Exercises - Seated Scapular Retraction  - 1 x daily - 7 x weekly - 2 sets - 10 reps - Seated Hamstring Stretch  - 1 x daily - 7 x weekly - 1 sets - 2 reps - 20 sec hold - Seated Posterior Pelvic Tilt  - 1 x daily - 7 x weekly - 2 sets - 10 reps - Seated Transversus Abdominis Bracing  - 1 x daily - 7 x weekly - 2 sets - 10 reps - Supine Posterior Pelvic Tilt  - 1 x daily - 7 x weekly - 2 sets - 10 reps - Seated March  - 1 x daily - 7  x weekly - 3 sets - 10 reps - Seated Long  Arc Quad  - 1 x daily - 7 x weekly - 1 sets - 10 reps - 5 hold - Sit to Stand  - 3 x daily - 7 x weekly - 1 sets - 5 reps - Seated Hip Adduction Isometrics with Ball  - 1 x daily - 7 x weekly - 1 sets - 10 reps - 5 hold - Staggered Stance Forward Backward Weight Shift with Counter Support  - 1 x daily - 7 x weekly - 1 sets - 10 reps - 5 hold   ASSESSMENT:   CLINICAL IMPRESSION:  Pt arrives pain free for aquatic PT today. Increased her reps and added UE resistive movements with her water walking. Tolerated all exercises well with no pain.   OBJECTIVE IMPAIRMENTS: decreased balance, difficulty walking, decreased strength, increased muscle spasms, impaired flexibility, postural dysfunction, and pain.    ACTIVITY LIMITATIONS: lifting, bending, sitting, and squatting   PARTICIPATION LIMITATIONS: cleaning, laundry, and community activity   PERSONAL FACTORS: Time since onset of injury/illness/exacerbation and 1 comorbidity: Pregnancy with EDD of 01/11/23  are also affecting patient's functional outcome.    REHAB POTENTIAL: Good   CLINICAL DECISION MAKING: Evolving/moderate complexity   EVALUATION COMPLEXITY: Moderate     GOALS: Goals reviewed with patient? Yes   SHORT TERM GOALS: Target date: 09/25/2022   Patient will be independent with initial HEP. Baseline: Goal status: MET on 10/07/2022   2.  Patient will report at least a 30% improvement in status since initial evaluation. Baseline:  Goal status: MET on 10/07/2022     LONG TERM GOALS: Target date: 11/06/2022   Patient will be independent with advanced HEP and knowledgeable with self progression. Baseline:  Goal status: ONGOING   2.  Patient will increase Lower Extremity Functional Scale to at least 40% to allow her to perform more tasks around home with decreased pain. Baseline: 16.3% Goal status: ONGOING   3.  Patient will increase bilateral hip strength to at least 5-/5 to allow  patient to perform functional tasks around home. Baseline:  Goal status: INITIAL   4.  Patient will report ability to go to the park or exercise at the [facility] without increased pain. Baseline:  Goal status: INITIAL     PLAN:   PT FREQUENCY: 2x/week   PT DURATION: 8 weeks   PLANNED INTERVENTIONS: Therapeutic exercises, Therapeutic activity, Neuromuscular re-education, Balance training, Gait training, Patient/Family education, Self Care, Joint mobilization, Joint manipulation, Aquatic Therapy, Dry Needling, Electrical stimulation, Spinal manipulation, Spinal mobilization, Cryotherapy, Moist heat, Taping, Ultrasound, Ionotophoresis 4mg /ml Dexamethasone, Manual therapy, and Re-evaluation.   PLAN FOR NEXT SESSION: [place] next    Myrene Galas, [place] 10/30/22 4:23 PM   [facility] [address], [address] [place], [place] [address] Phone # [phone] Fax [phone]

## 2022-11-03 ENCOUNTER — Encounter: Payer: Self-pay | Admitting: Rehabilitative and Restorative Service Providers"

## 2022-11-03 ENCOUNTER — Ambulatory Visit: Payer: Medicaid Other | Admitting: Rehabilitative and Restorative Service Providers"

## 2022-11-03 DIAGNOSIS — M5431 Sciatica, right side: Secondary | ICD-10-CM

## 2022-11-03 DIAGNOSIS — R262 Difficulty in walking, not elsewhere classified: Secondary | ICD-10-CM

## 2022-11-03 DIAGNOSIS — R2689 Other abnormalities of gait and mobility: Secondary | ICD-10-CM

## 2022-11-03 DIAGNOSIS — M6281 Muscle weakness (generalized): Secondary | ICD-10-CM

## 2022-11-03 NOTE — Therapy (Signed)
OUTPATIENT PHYSICAL THERAPY TREATMENT NOTE AND DISCHARGE SUMMARY   Patient Name: Alexis Caldwell MRN: [id number] DOB:[birth year], 37 y.o., female Today's Date: 11/03/2022  PCP: Trey Sailors, PA  REFERRING PROVIDER: Wende Mott, CNM   END OF SESSION:   PT End of Session - 11/03/22 1242     Visit Number 12    Date for PT Re-Evaluation 11/06/22    Authorization Type [employer]    Authorization Time Period [employer] approved 8 + 4 visits 09/10/2022-11/09/2022 (12 total)    Authorization - Visit Number 11    Authorization - Number of Visits 12    PT Start Time [id number]   pt arrived late for session   PT Stop Time 1310    PT Time Calculation (min) 29 min    Activity Tolerance Patient tolerated treatment well    Behavior During Therapy [facility] for tasks assessed/performed                   Past Medical History:  Diagnosis Date   Anxiety    Depression    Past Surgical History:  Procedure Laterality Date   NO PAST SURGERIES     Patient Active Problem List   Diagnosis Date Noted   Gestational diabetes mellitus (GDM), antepartum 10/21/2022   Decreased vascular flow 06/16/2022   Lumbar radiculopathy 04/23/2022   History of gestational diabetes 06/20/2014   Depression, unspecified 10/11/2013    REFERRING DIAG: [id number].Merril Abbe (ICD-10-CM) - Fall, initial encounter Z3A.21 (ICD-10-CM) - [redacted] weeks gestation of pregnancy M54.31 (ICD-10-CM) - Sciatica of right side   THERAPY DIAG:  Sciatica, right side  Difficulty in walking, not elsewhere classified  Muscle weakness (generalized)  Other abnormalities of gait and mobility  Rationale for Evaluation and Treatment Rehabilitation  PERTINENT HISTORY: Pregnant with estimated due date of 01/11/2023   PRECAUTIONS: Other: Pregnancy   SUBJECTIVE:                                                                                                                                                                                       SUBJECTIVE STATEMENT: Better day, no pain.     PAIN:  Are you having pain? No: NPRS scale: 0/10 Pain location: Pain description:  Aggravating factors: sitting for prolonged periods Relieving factors: movement   OBJECTIVE: (objective measures completed at initial evaluation unless otherwise dated)  DIAGNOSTIC FINDINGS:  N/A   PATIENT SURVEYS:  09/10/2022: LEFS 13 / 80 = 16.3 %  10/13/2022:  Lower Extremity Functional Score: 15 / 80 = 18.8 %  11/03/2022: Lower Extremity Functional Score: 54 / 80 = 67.5 %  SENSATION: Reports "coldness" and numbness feeling below right knee   MUSCLE LENGTH: Hamstrings: tightness noted bilat     POSTURE: rounded shoulders, forward head, and anterior pelvic tilt   PALPATION: Tenderness to palpation to bilateral lumbar paraspinals   LUMBAR ROM:    09/10/2022:  Limited secondary to pain and pregnancy     LOWER EXTREMITY MMT:     09/10/2022:  Bilateral hip strength of 4/5  11/03/2022:  Bilateral hip strength of 5/5   FUNCTIONAL TESTS:  09/10/2022: 5 times sit to stand: 24.4 sec 3 minute walk test: 446 ft  10/13/2022: 3 minute walk test:  402 ft     TODAY'S TREATMENT  319/2024: Lower Extremity Functional Score: 54 / 80 = 67.5 %, MMT Nustep level 3 x6 min with PT present to discuss status Sitting on green pball performing 4 way pelvic tilts, pelvic CW circles and CCW circles.  X20 each Quadruped alt UE/LE extension 2x10 Side-bending over peanut ball x10 bilat Tandem gait x10 ft Single leg stance x5 sec bilat   10/30/22:Pt arrives for aquatic physical therapy. Treatment took place in 3.5-5.5 feet of water. Water temperature was 90 degrees F. Pt entered the pool via stairs, slowly with caution and use of hand rails. Pt requires buoyancy of water for support and to offload joints with strengthening exercises.  Seated water bench with 75% submersion Pt performed seated LE AROM exercises 20x in all planes while  discussing current status. 75% depth water walking 10 lengths of each direction with UE water wts for push pull. Post Pelvic tilt against the wall with single buoy wts lat/core press 10x3. Standing hip 3 ways 20x slowly Bil added ankle fins .Semi seated single buoy UE weights horizontal abd/add 20x..Hamstring stretch on second step 3x Bil 20 sec.Horizontal decompression float with 100% flotation.  10/27/2022: Nustep level 3 x6 min with PT present to discuss status Sitting on green pball performing 4 way pelvic tilts, pelvic CW circles and CCW circles.  2X20 each Sidelying with blue foam roll under top leg:  open books 2x10 bilat Supine posterior pelvic tilts 2x10 Supine bent knee fall-outs 2x10 Supine TA contraction with hands pressing into thighs 2x10 Supine hamstring stretch with strap 2x20 sec bilat     PATIENT EDUCATION:  Education details: Issued HEP Person educated: Patient Education method: Explanation, Demonstration, and Handouts Education comprehension: verbalized understanding and returned demonstration   HOME EXERCISE PROGRAM: Access Code: [email or web address] URL: [email or web address] Date: 11/03/2022 Prepared by: Shelby Dubin Orvil Faraone  Exercises - Seated Scapular Retraction  - 1 x daily - 7 x weekly - 2 sets - 10 reps - Seated Hamstring Stretch  - 1 x daily - 7 x weekly - 1 sets - 2 reps - 20 sec hold - Seated Posterior Pelvic Tilt  - 1 x daily - 7 x weekly - 2 sets - 10 reps - Seated Transversus Abdominis Bracing  - 1 x daily - 7 x weekly - 2 sets - 10 reps - Supine Posterior Pelvic Tilt  - 1 x daily - 7 x weekly - 2 sets - 10 reps - Seated March  - 1 x daily - 7 x weekly - 3 sets - 10 reps - Seated Long Arc Quad  - 1 x daily - 7 x weekly - 1 sets - 10 reps - 5 hold - Sit to Stand  - 3 x daily - 7 x weekly - 1 sets - 5 reps - Seated Hip Adduction Isometrics with Ball  - 1 x  daily - 7 x weekly - 1 sets - 10 reps - 5 hold - Staggered Stance Forward Backward Weight Shift with  Counter Support  - 1 x daily - 7 x weekly - 1 sets - 10 reps - 5 hold - Single Leg Stance  - 1 x daily - 7 x weekly - 1 sets - 2 reps - 30 sec hold - Tandem Walking  - 1 x daily - 7 x weekly - 3 sets - 10 reps - Bird Dog  - 1 x daily - 7 x weekly - 2 sets - 10 reps   ASSESSMENT:   CLINICAL IMPRESSION:  Ms Dimock arrives to PT stating that she is still not having pain and is feeling like her leg is stronger and more stable.  Patient has made great progress and met lower extremity functional scale goal as well as increased strength.  Patient has membership to the [facility] and states that her pool there has similar equipment that she has been using with our aquatic sessions.  Patient recommended to continue with HEP and pool exercises at [facility], she verbalizes understanding.  Patient has met all PT goals and was discharged from skilled PT at this time to continue with HEP.  OBJECTIVE IMPAIRMENTS: decreased balance, difficulty walking, decreased strength, increased muscle spasms, impaired flexibility, postural dysfunction, and pain.    ACTIVITY LIMITATIONS: lifting, bending, sitting, and squatting   PARTICIPATION LIMITATIONS: cleaning, laundry, and community activity   PERSONAL FACTORS: Time since onset of injury/illness/exacerbation and 1 comorbidity: Pregnancy with EDD of 01/11/23  are also affecting patient's functional outcome.    REHAB POTENTIAL: Good   CLINICAL DECISION MAKING: Evolving/moderate complexity   EVALUATION COMPLEXITY: Moderate     GOALS: Goals reviewed with patient? Yes   SHORT TERM GOALS: Target date: 09/25/2022   Patient will be independent with initial HEP. Baseline: Goal status: MET on 10/07/2022   2.  Patient will report at least a 30% improvement in status since initial evaluation. Baseline:  Goal status: MET on 10/07/2022     LONG TERM GOALS: Target date: 11/06/2022   Patient will be independent with advanced HEP and knowledgeable with self progression. Baseline:   Goal status: MET   2.  Patient will increase Lower Extremity Functional Scale to at least 40% to allow her to perform more tasks around home with decreased pain. Baseline: 16.3% Goal status: MET   3.  Patient will increase bilateral hip strength to at least 5-/5 to allow patient to perform functional tasks around home. Baseline:  Goal status: MET   4.  Patient will report ability to go to the park or exercise at the [facility] without increased pain. Baseline:  Goal status: MET     PLAN:   PT FREQUENCY: 2x/week   PT DURATION: 8 weeks   PLANNED INTERVENTIONS: Therapeutic exercises, Therapeutic activity, Neuromuscular re-education, Balance training, Gait training, Patient/Family education, Self Care, Joint mobilization, Joint manipulation, Aquatic Therapy, Dry Needling, Electrical stimulation, Spinal manipulation, Spinal mobilization, Cryotherapy, Moist heat, Taping, Ultrasound, Ionotophoresis 4mg /ml Dexamethasone, Manual therapy, and Re-evaluation.     PHYSICAL THERAPY DISCHARGE SUMMARY  Patient agrees to discharge. Patient goals were met. Patient is being discharged due to meeting the stated rehab goals.     Juel Burrow, PT 11/03/22 1:21 PM   [facility] [address] [place], [place] [address] Phone # [phone] Fax [phone]

## 2022-11-06 ENCOUNTER — Ambulatory Visit: Payer: Medicaid Other | Admitting: *Deleted

## 2022-11-06 ENCOUNTER — Ambulatory Visit: Payer: Medicaid Other | Admitting: Physical Therapy

## 2022-11-06 ENCOUNTER — Ambulatory Visit: Payer: Medicaid Other | Attending: Maternal & Fetal Medicine

## 2022-11-06 VITALS — BP 114/60 | HR 101

## 2022-11-06 DIAGNOSIS — O24410 Gestational diabetes mellitus in pregnancy, diet controlled: Secondary | ICD-10-CM | POA: Insufficient documentation

## 2022-11-06 DIAGNOSIS — Z362 Encounter for other antenatal screening follow-up: Secondary | ICD-10-CM | POA: Diagnosis present

## 2022-11-06 DIAGNOSIS — O09522 Supervision of elderly multigravida, second trimester: Secondary | ICD-10-CM | POA: Insufficient documentation

## 2022-11-06 DIAGNOSIS — Z3A30 30 weeks gestation of pregnancy: Secondary | ICD-10-CM

## 2022-11-06 DIAGNOSIS — O09523 Supervision of elderly multigravida, third trimester: Secondary | ICD-10-CM | POA: Diagnosis not present

## 2022-11-06 DIAGNOSIS — O99212 Obesity complicating pregnancy, second trimester: Secondary | ICD-10-CM | POA: Diagnosis present

## 2022-11-06 DIAGNOSIS — E669 Obesity, unspecified: Secondary | ICD-10-CM | POA: Diagnosis not present

## 2022-11-06 DIAGNOSIS — O99213 Obesity complicating pregnancy, third trimester: Secondary | ICD-10-CM | POA: Diagnosis not present

## 2022-11-09 ENCOUNTER — Other Ambulatory Visit: Payer: Self-pay | Admitting: *Deleted

## 2022-11-09 DIAGNOSIS — O99213 Obesity complicating pregnancy, third trimester: Secondary | ICD-10-CM

## 2022-11-09 DIAGNOSIS — O24419 Gestational diabetes mellitus in pregnancy, unspecified control: Secondary | ICD-10-CM

## 2022-11-09 DIAGNOSIS — O09523 Supervision of elderly multigravida, third trimester: Secondary | ICD-10-CM

## 2022-11-30 DIAGNOSIS — O09899 Supervision of other high risk pregnancies, unspecified trimester: Secondary | ICD-10-CM | POA: Insufficient documentation

## 2022-12-02 ENCOUNTER — Ambulatory Visit: Payer: Medicaid Other

## 2022-12-03 ENCOUNTER — Ambulatory Visit: Payer: Medicaid Other | Attending: Maternal & Fetal Medicine

## 2022-12-03 ENCOUNTER — Ambulatory Visit: Payer: Medicaid Other | Admitting: *Deleted

## 2022-12-03 VITALS — BP 129/60 | HR 103

## 2022-12-03 DIAGNOSIS — O99213 Obesity complicating pregnancy, third trimester: Secondary | ICD-10-CM | POA: Diagnosis not present

## 2022-12-03 DIAGNOSIS — O24410 Gestational diabetes mellitus in pregnancy, diet controlled: Secondary | ICD-10-CM

## 2022-12-03 DIAGNOSIS — O09523 Supervision of elderly multigravida, third trimester: Secondary | ICD-10-CM | POA: Insufficient documentation

## 2022-12-03 DIAGNOSIS — O24419 Gestational diabetes mellitus in pregnancy, unspecified control: Secondary | ICD-10-CM | POA: Insufficient documentation

## 2022-12-03 DIAGNOSIS — E669 Obesity, unspecified: Secondary | ICD-10-CM

## 2022-12-03 DIAGNOSIS — Z3A34 34 weeks gestation of pregnancy: Secondary | ICD-10-CM

## 2022-12-03 DIAGNOSIS — O09899 Supervision of other high risk pregnancies, unspecified trimester: Secondary | ICD-10-CM | POA: Diagnosis present

## 2022-12-07 ENCOUNTER — Encounter (HOSPITAL_COMMUNITY): Payer: Self-pay | Admitting: Obstetrics & Gynecology

## 2022-12-07 ENCOUNTER — Inpatient Hospital Stay (HOSPITAL_COMMUNITY)
Admission: AD | Admit: 2022-12-07 | Discharge: 2022-12-07 | Disposition: A | Discharge status: Home / Self Care | Payer: Medicaid Other | Attending: Obstetrics & Gynecology | Admitting: Obstetrics & Gynecology

## 2022-12-07 DIAGNOSIS — N898 Other specified noninflammatory disorders of vagina: Secondary | ICD-10-CM | POA: Diagnosis present

## 2022-12-07 DIAGNOSIS — O4703 False labor before 37 completed weeks of gestation, third trimester: Secondary | ICD-10-CM | POA: Diagnosis not present

## 2022-12-07 DIAGNOSIS — O479 False labor, unspecified: Secondary | ICD-10-CM

## 2022-12-07 DIAGNOSIS — Z0371 Encounter for suspected problem with amniotic cavity and membrane ruled out: Secondary | ICD-10-CM | POA: Diagnosis not present

## 2022-12-07 DIAGNOSIS — Z3A35 35 weeks gestation of pregnancy: Secondary | ICD-10-CM | POA: Diagnosis not present

## 2022-12-07 LAB — URINALYSIS, ROUTINE W REFLEX MICROSCOPIC
Bilirubin Urine: NEGATIVE
Glucose, UA: NEGATIVE mg/dL
Hgb urine dipstick: NEGATIVE
Ketones, ur: 5 mg/dL — AB
Leukocytes,Ua: NEGATIVE
Nitrite: NEGATIVE
Protein, ur: 30 mg/dL — AB
Specific Gravity, Urine: 1.024 (ref 1.005–1.030)
pH: 6 (ref 5.0–8.0)

## 2022-12-07 LAB — POCT FERN TEST: POCT Fern Test: NEGATIVE

## 2022-12-07 NOTE — MAU Provider Note (Signed)
History     [id number]  Arrival date and time: 12/07/22 1858    Chief Complaint  Patient presents with   Vaginal Discharge   Rupture of Membranes   Contractions     HPI Alexis Caldwell is a 37 y.o. [id number] at [redacted]w[redacted]d by LMP who presents for vaginal discharge & cramping. Reports gush of yellow fluid this afternoon. Leaking has not continued. Has also been having increase in abdominal cramping since then. Denies n/v/d, dysuria, vaginal bleeding, or recent intercourse. Reports good fetal movement. Has first appointment with [facility] this Friday. Transferred from [facility] for possibility of water birth.     O/Positive/-- (10/30 0000)  OB History     Gravida  4   Para  2   Term  2   Preterm      AB  1   Living  2      SAB  1   IAB  0   Ectopic      Multiple      Live Births  2           Past Medical History:  Diagnosis Date   Anxiety    Depression     Past Surgical History:  Procedure Laterality Date   NO PAST SURGERIES      Family History  Problem Relation Age of Onset   Stroke Mother    Heart disease Mother    Hypertension Mother    Diabetes Mother    Diabetes Father    Heart disease Maternal Aunt    Obesity Maternal Grandfather    Heart disease Maternal Grandfather    Asthma Neg Hx     Social History   Socioeconomic History   Marital status: Single    Spouse name: Not on file   Number of children: Not on file   Years of education: Not on file   Highest education level: Not on file  Occupational History   Not on file  Tobacco Use   Smoking status: Former    Types: Cigarettes    Quit date: 04/15/2022    Years since quitting: 0.6   Smokeless tobacco: Never  Vaping Use   Vaping Use: Never used  Substance and Sexual Activity   Alcohol use: Not Currently   Drug use: No   Sexual activity: Yes  Other Topics Concern   Not on file  Social History Narrative   Not on file   Social Determinants of Health   Financial Resource Strain: Not  on file  Food Insecurity: No Food Insecurity (10/21/2022)   Hunger Vital Sign    Worried About Running Out of Food in the Last Year: Never true    Ran Out of Food in the Last Year: Never true  Transportation Needs: Not on file  Physical Activity: Not on file  Stress: Not on file  Social Connections: Not on file  Intimate Partner Violence: Not on file    Allergies  Allergen Reactions   Penicillins     No current facility-administered medications on file prior to encounter.   Current Outpatient Medications on File Prior to Encounter  Medication Sig Dispense Refill   ACCU-CHEK GUIDE test strip TEST STRIPS TO CHECK BLOOD SUGARS 4 TIMES A DAY     Accu-Chek Softclix Lancets lancets SMARTSIG:Lancet(s) Topical 4 Times Daily     acetaminophen (TYLENOL) 325 MG tablet Take 2 tablets (650 mg total) by mouth every 6 (six) hours as needed. (Patient not taking: Reported on 06/23/2022) 36 tablet 0  Prenatal Vit-Fe Fumarate-FA (PRENATAL MULTIVITAMIN) TABS tablet Take 1 tablet by mouth daily at 12 noon.       ROS Pertinent positives and negative per HPI, all others reviewed and negative  Physical Exam   BP 116/75   Pulse 99   Temp 97.7 F (36.5 C)   Resp 16   Ht  (1.575 m)   Wt 108 kg   LMP 04/06/2022   SpO2 99%   BMI 43.57 kg/m   Patient Vitals for the past 24 hrs:  BP Temp Pulse Resp SpO2 Height Weight  12/07/22 2135 116/75 -- 99 -- -- -- --  12/07/22 1918 122/63 97.7 F (36.5 C) (!) 109 16 99 %  (1.575 m) 108 kg    Physical Exam Vitals and nursing note reviewed. Exam conducted with a chaperone present.  Constitutional:      General: She is not in acute distress.    Appearance: Normal appearance. She is not ill-appearing.  HENT:     Head: Normocephalic and atraumatic.  Eyes:     General: No scleral icterus.    Pupils: Pupils are equal, round, and reactive to light.  Pulmonary:     Effort: Pulmonary effort is normal. No respiratory distress.  Abdominal:      Tenderness: There is no abdominal tenderness.     Comments: gravid  Genitourinary:    Comments: Pelvic: NEFG, scant amount of thick white discharge. No pooling of fluid. No blood.  Skin:    General: Skin is warm and dry.  Neurological:     Mental Status: She is alert.      Cervical Exam Dilation: 3 Effacement (%): Thick Cervical Position: Posterior Station: Ballotable Exam by:: Judeth Horn, NP   FHT Baseline 150, moderate variability, 15x15 accels, no decels Toco: none Cat: 1  Labs Results for orders placed or performed during the hospital encounter of 12/07/22 (from the past 24 hour(s))  Urinalysis, Routine w reflex microscopic -Urine, Clean Catch     Status: Abnormal   Collection Time: 12/07/22  7:37 PM  Result Value Ref Range   Color, Urine YELLOW YELLOW   APPearance HAZY (A) CLEAR   Specific Gravity, Urine 1.024 1.005 - 1.030   pH 6.0 5.0 - 8.0   Glucose, UA NEGATIVE NEGATIVE mg/dL   Hgb urine dipstick NEGATIVE NEGATIVE   Bilirubin Urine NEGATIVE NEGATIVE   Ketones, ur 5 (A) NEGATIVE mg/dL   Protein, ur 30 (A) NEGATIVE mg/dL   Nitrite NEGATIVE NEGATIVE   Leukocytes,Ua NEGATIVE NEGATIVE   RBC / HPF 0-5 0 - 5 RBC/hpf   WBC, UA 0-5 0 - 5 WBC/hpf   Bacteria, UA RARE (A) NONE SEEN   Squamous Epithelial / HPF 0-5 0 - 5 /HPF   Mucus PRESENT   POCT fern test     Status: None   Collection Time: 12/07/22  9:43 PM  Result Value Ref Range   POCT Fern Test Negative = intact amniotic membranes     Imaging No results found.  MAU Course  Procedures Lab Orders         Urinalysis, Routine w reflex microscopic -Urine, Clean Catch         POCT fern test    No orders of the defined types were placed in this encounter.  Imaging Orders  No imaging studies ordered today    MDM moderate  Assessment and Plan   1. Encounter for suspected PROM, with rupture of membranes not found   2. Alexis Caldwell  contractions   3. [redacted] weeks gestation of pregnancy    -Sterile  speculum exam performed. No pooling of fluid & fern test negative.  -Cervix 3/thick/ballotable. No regular contractions & cervix unchanged after 1+ hour of monitoring.  -Preterm labor precautions reviewed. Patient to keep Ob appointment this Friday.   #FWB: cat 1 tracing    Dispo: discharged to home in stable condition.   Discharge Instructions     Discharge patient   Complete by: As directed    Discharge disposition: 01-Home or Self Care   Discharge patient date: 12/07/2022       Judeth Horn, NP 12/07/22 10:43 PM  Allergies as of 12/07/2022       Reactions   Penicillins         Medication List     TAKE these medications    Accu-Chek Guide test strip Generic drug: glucose blood TEST STRIPS TO CHECK BLOOD SUGARS 4 TIMES A DAY   Accu-Chek Softclix Lancets lancets SMARTSIG:Lancet(s) Topical 4 Times Daily   acetaminophen 325 MG tablet Commonly known as: Tylenol Take 2 tablets (650 mg total) by mouth every 6 (six) hours as needed.   prenatal multivitamin Tabs tablet Take 1 tablet by mouth daily at 12 noon.

## 2022-12-07 NOTE — MAU Note (Signed)
.  Alexis Caldwell is a 37 y.o. at [redacted]w[redacted]d here in MAU reporting: around 1200 today pt states she has a sudden sharp vaginal pain and then leaking clear and yellow, odorless fluid all in her underwear and the chair she was sitting in. Pt denies continue to leak fluid, currently just clear and yellow mucus discharge. Pt report intermittent lower ABD pain for the last hours and half. Pt reports last intercourse yesterday afternoon. Pt denies DFM, VB, PIH s/s, and complications in the pregnancy.   Onset of complaint: 1200 Pain score: 5/10 Vitals:   12/07/22 1918  BP: 122/63  Pulse: (!) 109  Resp: 16  Temp: 97.7 F (36.5 C)  SpO2: 99%     FHT:140 Lab orders placed from triage:  UA

## 2022-12-11 ENCOUNTER — Ambulatory Visit (INDEPENDENT_AMBULATORY_CARE_PROVIDER_SITE_OTHER): Payer: Medicaid Other | Admitting: Advanced Practice Midwife

## 2022-12-11 ENCOUNTER — Other Ambulatory Visit (HOSPITAL_COMMUNITY)
Admission: RE | Admit: 2022-12-11 | Discharge: 2022-12-11 | Disposition: A | Discharge status: Home / Self Care | Payer: Medicaid Other | Source: Ambulatory Visit | Attending: Advanced Practice Midwife | Admitting: Advanced Practice Midwife

## 2022-12-11 ENCOUNTER — Encounter: Payer: Self-pay | Admitting: Advanced Practice Midwife

## 2022-12-11 ENCOUNTER — Other Ambulatory Visit: Payer: Self-pay

## 2022-12-11 VITALS — BP 103/74 | HR 115 | Wt 234.0 lb

## 2022-12-11 DIAGNOSIS — O09899 Supervision of other high risk pregnancies, unspecified trimester: Secondary | ICD-10-CM | POA: Insufficient documentation

## 2022-12-11 DIAGNOSIS — O09893 Supervision of other high risk pregnancies, third trimester: Secondary | ICD-10-CM | POA: Diagnosis not present

## 2022-12-11 DIAGNOSIS — O26893 Other specified pregnancy related conditions, third trimester: Secondary | ICD-10-CM

## 2022-12-11 DIAGNOSIS — Z3A35 35 weeks gestation of pregnancy: Secondary | ICD-10-CM

## 2022-12-11 DIAGNOSIS — O24410 Gestational diabetes mellitus in pregnancy, diet controlled: Secondary | ICD-10-CM | POA: Diagnosis not present

## 2022-12-11 DIAGNOSIS — N898 Other specified noninflammatory disorders of vagina: Secondary | ICD-10-CM

## 2022-12-11 NOTE — Patient Instructions (Addendum)
[place] [facility] Providers  [place] ([address]) [facility] Brown, MD; Chambliss, MD; Eniola, MD; Hensel, MD; McDiarmid, MD; McIntyer, MD [address] [place], [place] [address] [phone] Mon-Fri 8:30-12:30, 1:30-5:00  Providers come to see babies during newborn hospitalization Only accepting infants of Mother's who are seen at Family Medicine Center or have siblings seen at   [facility] Center Medicaid - Yes; Tricare - Yes   [facility] Mulberry, MD [address] [place], [place] [address] [phone] Mon, Tue, Thur, Fri 8:30-5:00, Wed 10:00-7:00 (closed 1-2pm daily for lunch) Takes [place] residents with no insurance.  [place] [facility] only with Medicaid/insurance; Tricare - no  [facility] Ben-Davies, MD; Brown, MD; Chandler, MD; Ettefagh, MD; Grant, MD; Hanvey, MD; Herrin, MD; Jones,  MD; Lester, MD; McCormick, MD; McQueen, MD; Simha, MD; Stanley, MD; Stryffeler, NP [address], [place], [place] [address] [phone] Mon, Tue, Thur, Fri 8:30-5:30, Wed 9:30-5:30, Sat 8:30-12:30 Only accepting infants of first-time parents or siblings of current patients Hospital discharge coordinator will make follow-up appointment Medicaid - yes; Tricare - yes  [place] ([address]) [facility] Cox, MD; Davis, MD; Dovico, MD; Ettefaugh, MD; Lowe, MD; Nation, MD; Slimp, MD; Sumner, MD; Williams, MD [address], [place], [place] [address] [phone] Mon-Fri 8:30-5:00, closed for lunch 12:30-1:30; Sat-Sun 10:00-1:00 Accepting Newborns with commercial insurance only, must call prior to delivery to be accepted into  practice.  Medicaid - no, Tricare - yes   [facility] [address] [place], [place] [address] [phone] or [phone] Mon to Fri 8am to 10pm, Sat 8am to 1pm  (virtual only on weekends) Only accepts Medicaid Healthy Blue pts  [facility]  Artis, MD; Coccaro, MD; [place], MD; Netherton, NP; Roper, MD; Wilmot, PA-C; Skinner, MD [address] [place], [place] [address] [phone] Mon-Fri 8:30-5:30 Medicaid - yes, Tricare - yes  [place] ([address]) [facility] Warner, MD [address], [place], [place] [address] [phone] Mon, Tues, Wed Fri 8:30-5:00, Sat 8:30-12:00, Closed Thursdays Accepting siblings of established patients and first time mom's if you call prenatally Medicaid- yes; Tricare - yes  [facility] Becker, PA; Hagler, MD; Quinn, PA-C; Scifres, PA; Sun, MD; Swayne, MD;  [address], [place], [place] [address] [phone] Mon-Fri 8:30-5:00, closed for lunch 1-2 Only accepting newborns of established patients Medicaid- no; Tricare - yes  [place] ([address]) [facility] Timberlake, MD; [address], [place], [place] [address] [phone] Mon-Fri 8:00-5:00 Medicaid - No; Tricare - Yes  [facility] [employer]  Brake, NP; Wharton, PA [address], [place], [place] [address] [phone] Mon-Fri 8:00-5:00 Medicaid - No, Tricare - Yes  [facility] Gay, MD; Quinlan, MD; Blatt, DNP [address] [place], [place] [address] [phone]  Mon-Fri 8:00-5:00 Medicaid - No; Tricare - Yes  [facility] [address] , [place] [address] [phone] Mon-Fri 8:30-5:00 (lunch 12:00-1:00) Medicaid -Yes; Tricare - Yes  [facility] Jordan, MD [address], ,  [address] [phone] Mon-Fri 8:00-5:00 Seeing newborns of current patients only. No new patients Medicaid - No, Tricare - yes  [facility] Parker, MD [address] ,  [address] [phone] Mon-Fri 8:00-5:00 Medicaid -yes as secondary coverage only;   Tricare - yes  [facility] Brecken, PA; Christy, NP; Dees, MD; DeClaire, MD; DeWeese, MD; Hodge, PA; Smoot, NP; Summer, MD; Vapne, MD [address] [place], [place] [address] [phone] Mon-Fri 8:30-5:00, Sat 9:00-11:00 Accepts commercial insurance ONLY. Offers free prenatal information sessions for families. Medicaid - No, Tricare - Call first  [facility] Bouska, MD; Gordon, PA; Jeffery, PA; Weber, PA [address] [place] [address] [phone] Mon-Fri 7:30-5:30 Medicaid - Yes; Tricare - yes  [place] ([address] & [address])  [facility] Reese, MD [address] [place], [place] [address] [phone] Mon-Thur 8:00-6:00, closed for lunch 12-2, closed Fridays Medicaid - yes; Tricare - no  [facility] Anderson, NP; Badger, MD; Beal, PA; Spencer, PA [address], [place], [place] [address] [phone] Mon-Fri 7:30-4:30 Medicaid - yes, Tricare - yes  [facility]  Agbuya, MD; Klett, NP; Romgoolam, MD; Rothstein, NP [address], [place], [place] [address] [phone] Mon-Fri 8:30-5:00, closed for lunch 1-2, Sat 8:30-12:00 - sick visits only Providers come to see babies at [facility] Only accepting newborns of siblings and first time parents ONLY if who have met with office prior to delivery Medicaid -Yes; Tricare - yes  [facility] [place]  Golden, DO; Friddle, NP; Wallace, MD; Wood, MD:  [address], [place], [place] [address] [phone] Mon- Fri 8:00-5:00, Sat 9:00-12:00 - sick visits only Accepting siblings of established patients and first time mom/baby Medicaid - Yes; Tricare - yes Patients must have vaccinations (baby vaccines)  [place] ([address]  [address])  [facility] [place] [facility] [address] [place], [place] [address] [phone] Mon-Fri 8:00-5:00 Medicaid - no; Tricare - yes  [facility] Briscoe, MD; Schmidt, PA; Moreira, PA [address], [place], [place] [address] [phone] Mon-Fri 8:00-5:00 Medicaid- yes; Tricare - yes  [facility] Boyd, MD; Jones, NP; Osborn, PA [address], , [place] [address] [phone] Mon-Fri 8:00-5:00 Medicaid - Yes; Tricare - yes  [place] ([address])  [facility] Atkinson, PA; Calderon, PA; Cummings, MD; Dillard, MD; Henrish, NP; Isenhour, DO; Martin, PA; Olson, MD; Ott, MD; Phillips, MD; Valente, PA; VanDeven, PA; Yonjof, NP [address], [place], [place] [address] [phone] Mon-Fri 8:30-5:00, Sat 9:00-12:00 - sick only Please register online [email or web address] then schedule online or call office Medicaid-Yes; Tricare -yes  [facility] [place]  Dabrusco, MD; Dial, MD; [place], MD; Fleenor, NP; Goolsby, PA; Tonuzi, MD; Turner, NP; West, MD [address], [place], [place] [address] [phone] Mon-Fri 8:00-5:30, Sat&Sun by appointment (phones open at 8:30) Medicaid - Yes; Tricare - yes  [place] ([address]) [facility] Allen, CPNP; Bates, MD; Gordon, MD; Mills, NP; Weinshilboum, DO [address], [place], [place] [address] [phone] M-F 8:00 - 5:15, Sat/Sun 9-12 sick visits only Medicaid - No; Tricare - yes  [facility] [place] [facility]  Brown, PA-C; Cowen, PA-C; Dennis, DO; Fuster, PA-C; Martin, PA-C; Shelton, PA-C; Spry, MD [address] [place], [place] [address] [phone] Mon-Thur 8:00-7:00, Fri 8:00-5:00 Accepting Medicaid for 13 and under only   [facility] [place] [facility] [place]) Hayes, FNP; List, FNP; Moran, MD; Pitonzo, PA-C; Scholer,   MD; Spangle, FNP; Nzenwa, FNP; Jasper, MD; Moran, MD [address] [place], [place] [address] [phone] Mon-Fri 8:30-5:30 Medicaid - Yes; Tricare - yes  [facility] [address] [facility]  Kelly, CPNP; Logan, MD; Poth, MD; Ramadoss, MD; Staton, NP [address], [place], [place] [address] [phone] Mon-Thur 8:00-5:30, Fri 8:00-5:00, Sat 9:00-12:00 Medicaid - yes, Tricare - yes  [place] ([address])  [facility] [place] Masneri, DO; Meyers, MD; Nelson, PA [address], [place], [place] [address] [phone] Mon-Fri 8:00-5:00, closed for lunch 12-1 Medicaid - No; Tricare - yes  [facility] [place] McGowen, MD [address], [place], [place] [address] [phone] Mon-Fri 8:00-5:00 Medicaid - No; Tricare - yes  [facility] [place] MacDonald, MD; Nayak, MD; Kearns, MD; Jones, MD [address] [place] [address], [place], [place] [address] [phone] Mon-Fri 8:00-5:00 Medicaid- Yes; Tricare - yes  [place] ([address])  [facility] [place] Martin, PA-C; Tabori, MD [address], [place], [place] [address] [phone] Mon-Fri 8:00-5:00 Medicaid - No; Tricare - yes  [facility] [place]  Margin - CPNP [address], [place], [place] [address] [phone] Mon-Weds 8:00-6:00, Thurs-Fri 8:00-5:00, Sat 9:00-12:00 Medicaid - yes; Tricare - yes   [facility] [place] Aubuchon, MD; Brandon, PA [address] [place], [place] [address] [phone] Mon-Fri 8:00-5:00 Medicaid - yes; Tricare - yes  [place] [facility] Providers  [facility] [address], [place], [place] [address] [phone] M, Thur: 8am -8pm, Tues, Weds: 8am - 5pm; Fri: 8-1 Medicaid - Yes; Tricare -  yes  [facility] Mertz, MD; Johnson, MD; Wells, MD; Downs, PA; Hockenberger, PA [address], [place], [place] [address] [phone] M-F 8:30 - 5:00 Medicaid - Call office; Tricare -yes  [facility] Bonney, MD; Page, MD, Minter, MD; Mueller, PNP; Thomason, NP [address], [place], [place] [address] [phone] M-F 8:30 - 5:00, Sat/Sun 8:30 - 12:30 (sick visits) Medicaid - Call office; Tricare -yes  [facility] Lewis, MD; Shaub, PNP; Boylston, MD; Quaile, PA; Nonato, NP; Landon, CPNP [address], [place], [place] [address] [phone] M-F 8:30 - 5:00 Medicaid - Call office; Tricare - yes  [facility] [place] Cline, MD; Dvergsten, MD; Flores, MD; Kawatu, MD; Nogo, MD [address], [place], [place] [address] [phone] M-Thur: 8:00 - 5:00; Fri: 8:00 - 4:00 Medicaid - yes; Tricare - yes  [facility] [address] [place], [place], [place] [address] [phone] M-F: 8:30- 5:00, closed for lunch 12:30 - 1:00 Medicaid - yes; Tricare -yes  [facility] [place] [address], [place], [place] [address] [phone] M-F 8:00 - 5:00 Medicaid - yes; Tricare - yes  [facility] Johnson, DO; Rumball, DO; Wicker, NP [address] [place], [place], [place] [address] [phone] M-F 8:00 - 5:00, Closed 12-1 for lunch Medicaid - Call; Tricare - yes  [facility] Stein, MD [address], [place], [place] [address] [phone] M-F: 8:00-5:00, Sat: 8:00 - noon Medicaid - call; Tricare -yes  [place] [facility] Providers  [facility] Collins, FNP-C [address], [place], [place] [address] [phone] M-W: 8:00-5:00, Thur: 8:00 - 7:00, Fri: 8:00 - noon Medicaid - yes; Tricare - yes  [facility] [place] Adams, FNP [address], [place],   [place] [address] [phone] M-F 8:00 - 5:00, Closed for lunch 12-1 Medicaid -  yes; Tricare - yes  [place] [facility] Providers  [facility] [place]arco Zaldivar, FNP, Melvin, MD, Fay, FNP-C [address] [place] [address] [place], [place], [place] [address] [phone] M-T 8:00-5:00, Wed-Fri 7:00-6:00 Medicaid - Yes; Tricare -yes  [facility] [place] Civiletti, DO; [address], [place], [place] [address] [phone] M-F 8:00 - 5:00, closed for lunch 12-1 Medicaid - Yes; Tricare - yes  [facility]  Barnes, MD; Bergdolt, MD; Caulfield, MD; Emrich, MD; Fiscus, MD; Hoppens, MD; Kylstra, MD, McPherson, MD; Todd, MD; Prestwood, MD; Waters, MD; Wood, MD [address], [place], [place] [address] [phone] M-F 8:00-5:00 Medicaid - yes; Tricare - yes  [facility] Cheema, MD (speaks Punjabi and Hindi) [address] [place], [place] [address] [phone] M-F: 8:30 - 5:00, closed 12:30 - 1 for lunch Medicaid - Yes; Tricare -yes  [place] [facility] Providers  [facility] Loda, MD; Timberlake, MD; Burke, MD [address], [place], [place] [address] [phone] M-Th: 8:00 - 5:30, Fri: 8:00 - 12:00 Medicaid - yes; Tricare - yes  [facility] [place] Lookabill, NP; Meier, MD; Daffron, MD [address], [place], [place] [address] [phone] M-F: 8:00 - 5:00 Medicaid - yes; Tricare - yes  [facility] Busse, NP; Bowman, NP; Baune, NP; Entwistle, MD; Williams, MD, Huffman, NP, Ferguson, MD; Patel, DO [address], [place], [place] [address] [phone] M-F: 8:30 - 5:30p Medicaid - yes; Tricare - yes Other locations available as well  [facility] Rajan, MD; Wilson, MD; Morgan, PA-C, Domenech, PA-C; Myers, PA-C [address], [place], [place] [address] [phone] M-W: 8:00am - 7:00pm, Thurs: 8:00am - 8:00pm; Fri: 8:00am -  5:00pm, closed daily from 12-1 for lunch Medicaid - yes; Tricare - yes  [place] [facility] Providers  [facility] [place] Adams, MD; Crystal, FNP; Hadley, MD; Stokes, MD; Johnson, PNP; Brady, PA-C; West, PNP; [place], MD;  [address] [place] [address], [place], [place] [address] [phone] M - Fri: 8am - 5pm, Sat 9-noon Medicaid - Yes; Tricare -yes  [facility] [place] Nayak, MD; Jones, FNP; McDonald, MD; Kearns, MD [address] [place] [address], [place], [place] [phone] M-F 8:00 - 5:00 Medicaid - call; Tricare - yes  [facility] [place] Bell, MD; [place], PNP; Pinder, MD; Anderson, MD; Light, PA-C; Johnson, MD; Latta, MD; Saul, PNP; Rainey, MD; Clifford, MD; McClung, MD [address], [place], [place] [address] [phone] M-F 8:00am - 5:00pm; Sat. 9:00 - 11:00 Medicaid - yes; Tricare - yes  [facility] [place] Soldato-Couture, MD [address], [place], [place] [address] [phone] M-F 8:00 - 5:00 Medicaid - [place] Medicaid only; Tricare - yes  [facility] [place] Walker, MD; Davis, PNP; Ajizian, MD [address] [place] [address], [place], [place] [address] [phone] M-F 8:00 - 5:00 Medicaid - yes; Tricare - yes  [facility] [place] Barry, MD; Brown, MD, Forest, MD, Hazek, MD; Hoyle, MD; Rosha Cocker, MD; [address], [place], [place] [address] [phone] M-F: 8-5 Medicaid - yes; Tricare - yes  [facility] [place] Brady, Md; Dowlen, MD; [address] [place] [address], [place],  [address] [phone] M-F 8-5 Medicaid - yes; Tricare - yes  [facility] Kearns, MD; Nayak, MD;   Soldato-Courture, MD; Pellam-Palmer, DNP; Herring, PNP [address], [place], [place] [address] [phone] M-F 8-5 Medicaid - yes; Tricare - yes  [facility] Weathers, MD;  Merritt, PA-C; Davis-PA-C; Warnim[place], MD [address], [place], [place] [address] [phone] M-F 7am - 5 pm Medicaid - call; Tricare - yes  [facility] Hill, PNP; Erickson, MD; Robinson, MD [address], [place], [place] [address] [phone] M-F 8-5 Medicaid - yes; Tricare - yes  [facility] Kribbs, MD; [place], MD; Williams, FNP; Brooks, FNP; Boles, FNP; Romblad, PA-C; [place] - FNP [address], [place], [place] [address] [phone] M-F 8-5 Medicaid- yes; Tricare - yes  [facility] [place] [place] [place] [facility] [place] Yoder, MD; Verenes, MD; Armentrout, MD; [place], MD; Beasley, CPNP; Ford, MD; Erickson, MD; Rice, MD [address], [place], [place] [address] [phone] M-F: 8-5, Sat: 9-4, Sun 9-12 Medicaid - yes; Tricare - yes  [facility] Little, PNP; Davis, PNP [address], [place], [place] [address] [phone] M-F 8 - 5, closed 12-1 for lunch Medicaid - yes; Tricare - yes  [facility] Friesen, MD; Cnegia, MD; Rice, MD; Patel, DO [address], [place], [place] [address] [phone] M-F 8- 5:30 Medicaid - yes; Tricare - yes  [facility] [place] Zvolensky, MD; Ray, MD; Haas, MD [address] [address], [place], [place] [address] [phone] M: 8-7; Tues-Fri: 8-5; Sat: 9-12 Medicaid - yes; Tricare - yes  [facility] [place] Heinrich, MD; Meyer, MD; Clark, MD; Rhyne, MD; Aubuchon, MD [address], [place], [place] [address] [phone] M: 8-7; Tues-Fri: 8-5; Sat: 8:30-12:30 Medicaid - yes; Tricare - yes  [facility] [place] Bista, MD; Dillard, PA [address], [place], [place] [address] [phone] Mon-Fri: 8-5 Medicaid - yes;  Tricare - yes  [facility] [place] Beasley, CPNP; Mahle, CPNP; Rice, MD; Duffy, MD; Culler, MD; [address], [place], [place] [address] [phone] M-F: 8-5, closed 1-2 for lunch Medicaid - yes; Tricare - yes  [facility] Rickman, PA; Mounce, NP; Eun Vermeer, MD; Jordan, CPNP; Darty, PA; Ball, MD; Wallace, MD [address] [place] [address], [place], [place] [address] [phone] M-Thurs: 8-7; Fri: 8-6; Sat: 9-12; Sun 2-4 Medicaid - yes; Tricare - yes  [facility] Brown, MD; Shin, MD; Goodman, DNP, FNP; Sebesta, DO; [address], [place], [place] [address] [phone] M-F: 8-5 Medicaid - yes; Tricare - yes  [place] [facility] Providers  [facility] Dough, MD; Welsh, NP [address], [place], [place] [address] [phone] M - Fri: 8am - 5pm, closed for lunch 12-1 Medicaid - Yes; Tricare - yes  [facility] Manandhar, MD; Riley, MD; Sanger, DO; Vinocur, MD;Hall, PA; Walsh, PA; Campbell, NP [address], [place] [address] [phone] M-F 8:00 - 5:00, Sat 8:00 - 11:30 Medicaid - yes; Tricare - yes  [facility] Khan, MD; Redding, MD, Street, MD, Holt, MD, Burgart, MD; Rhyne, NP; Dickinson, PA;  [address] [place] [address], [place], [place] [address] [phone] M-F 8:10am - 5:00pm Medicaid - yes; Tricare - yes    [facility] Forada, MD; [place], NP [address] [place], [place] [address] [phone] M-F 8:00 - 5:00 Medicaid - [place] Medicaid only; Tricare - yes  [facility] [place] [address] [place], [place]; Lacon, NP [address], [place], [place] [address] [phone] M-F 8:00 - 5:00; Closed for lunch 12 - 1:00 Medicaid - yes;  Tricare - yes  [facility] Belva Crome, MD; Jonita Albee, FNP [address] [place], [place] [address] [phone] Mon 9-5; Tues/Wed 10-5; Thurs 8:30-5; Fri: 8-12:30 Medicaid - yes; Tricare - yes  [facility] Pediatric Providers  [facility]  Ayrshire, MD; [place], [place] [address], [place], [place] [address] [phone] phone [phone] fax M-F 7:15 - 4:30 Medicaid - yes; Tricare - yes  [facility] Karilyn Cota, MD; Lund, DO [address] [place], [place] [address] [phone] M-Fri: 8:30 - 5:00, closed for lunch everyday noon - 1pm Medicaid - Yes; Tricare - yes  [facility] Burdine, MD; Reuel Boom, MD; Dimas Aguas, MD; Neita Carp, MD; Disney, [place]; Bonnita Nasuti, [place]; Baldwin, [place]; [place], [place]; Lodge, [place] [address] [place], [place] [address] [phone] M-Thurs: 7:30am - 7:00pm; Friday 7:30am - 4pm; Sat: 8:00 - 1:00 Medicaid - Yes; Tricare - yes  [facility] Morton, MD; Conni Elliot, MD; Carroll Kinds, MD; [place], DO [address], [place], [place] [address] [phone] M-Thur: 8:00 - 5:00, Fri: 8:00 - Noon Medicaid - yes; Tricare - yes No [place] [employer]  [facility] Dettinger, MD; Nadine Counts, DO; [place], NP; Daphine Deutscher, NP; Lequita Halt, NP; [place] Sia, NP; Reginia Forts, NP; Darlyn Read, MD; [place], [place] [address] [place], [place] [address] [phone] M-F 8:00 - 5:00 Medicaid - yes; Tricare - yes  [facility] Cupey, FNP-C; Bucio, FNP-C [address] [place], [place] [address] [phone] M, W, R 8:00-5:00, Tues: 8:00am - 7:00pm; Fri 8:00 - noon Medicaid - Yes; Tricare - yes  [place]is, MD [address] [place], [place] [address] [phone]  M-Thurs 8:30-5:30, Fri: 8:30-12:30pm Medicaid - Yes; Tricare - N    Considering Waterbirth? Guide for patients at [facility] [employer]  [facility]) Why consider waterbirth? Gentle birth for babies  Less pain medicine used in labor  May allow for passive descent/less pushing  May reduce perineal tears  More mobility and instinctive maternal position changes  Increased maternal relaxation   Is waterbirth safe? What are the risks of infection, drowning or other complications? Infection:  Very low risk (3.7 % for tub vs 4.8% for bed)  7 in 8000 waterbirths with documented infection  Poorly cleaned equipment most common cause  Slightly lower group B strep transmission rate  Drowning  Maternal:  Very low risk  Related to seizures or fainting  Newborn:  Very low risk. No evidence of increased risk of respiratory problems in multiple large studies  Physiological protection from breathing under water  Avoid underwater birth if there are any fetal complications  Once baby's head is out of the water, keep it out.  Birth complication  Some reports of cord trauma, but risk decreased by bringing baby to surface gradually  No evidence of increased risk of shoulder dystocia. Mothers can usually change positions faster in water than in a bed, possibly aiding the maneuvers to free the shoulder.   There are 2 things you MUST do to have a waterbirth  with [facility]: Attend a waterbirth class at [facility]   3rd Wednesday of every month from 7-9 pm (virtual during COVID) [employer] at [email or web address] or [email or web address] or by calling [phone] Bring [place] the certificate from the class to your prenatal appointment or send via MyChart Meet with a midwife at 36 weeks* to see if you can still plan a waterbirth and to sign the consent.   *We also recommend that you schedule as many of your prenatal visits with a midwife as possible.    Helpful information: You may want to bring a bathing suit top to the hospital to wear during labor but this is optional.  All other supplies are provided by  the hospital. Please arrive at the hospital with signs of active labor, and do not wait at home until late in labor. It takes 45 min- 1 hour for fetal monitoring, and check in to your room to take place, plus transport and filling of the waterbirth tub.    Things that would prevent you from having a waterbirth: Premature, <37wks  Previous cesarean birth  Presence of thick meconium-stained fluid  Multiple gestation (Twins, triplets, etc.)  Uncontrolled diabetes or gestational diabetes requiring medication  Hypertension diagnosed in pregnancy or preexisting hypertension (gestational hypertension, preeclampsia, or chronic hypertension) Fetal growth restriction (your baby measures less than 10th percentile on ultrasound) Heavy vaginal bleeding  Non-reassuring fetal heart rate  Active infection (MRSA, etc.). Group B Strep is NOT a contraindication for waterbirth.  If your labor has to be induced and induction method requires continuous monitoring of the baby's heart rate  Other risks/issues identified by your obstetrical provider   Please remember that birth is unpredictable. Under certain unforeseeable circumstances your provider may advise against giving birth in the tub. These decisions will be made on a case-by-case basis and with the safety of you and your baby as our highest priority.    Updated 11/19/21

## 2022-12-11 NOTE — Progress Notes (Signed)
   PRENATAL VISIT NOTE  Subjective:  Alexis Caldwell is a 37 y.o. [id number] at [redacted]w[redacted]d being seen today for ongoing prenatal care. Transferring care from [facility] for waterbirth. She is currently monitored for the following issues for this high-risk pregnancy and has Lumbar radiculopathy; History of gestational diabetes; Depression, unspecified; Decreased vascular flow; Gestational diabetes mellitus (GDM), antepartum; and Supervision of other high risk pregnancy, antepartum on their problem list.  Patient reports vaginal discharge no bleeding, no leaking, and occasional contractions.  Contractions: Not present. Vag. Bleeding: None.  Movement: Present. Denies leaking of fluid.   The following portions of the patient's history were reviewed and updated as appropriate: allergies, current medications, past family history, past medical history, past social history, past surgical history and problem list.   Objective:   Vitals:   12/11/22 1024  BP: 103/74  Pulse: (!) 115  Weight: 234 lb (106.1 kg)    Fetal Status: Fetal Heart Rate (bpm): 146   Movement: Present     General:  Alert, oriented and cooperative. Patient is in no acute distress.  Skin: Skin is warm and dry. No rash noted.   Cardiovascular: Normal heart rate noted  Respiratory: Normal respiratory effort, no problems with respiration noted  Abdomen: Soft, gravid, appropriate for gestational age.  Pain/Pressure: Present     Pelvic: Cervical exam deferred        Extremities: Normal range of motion.  Edema: Trace  Mental Status: Normal mood and affect. Normal behavior. Normal judgment and thought content.     Assessment and Plan:  Pregnancy: [id number] at [redacted]w[redacted]d  1. Supervision of other high risk pregnancy, antepartum - F/U weekly  - GBS NV - Discussed office routines, how to contact office, MAU PRN for labor or emergencies - Pt is interested in waterbirth.  No contraindications at this time per chart review/patient assessment.   - Pt  took class, see CNMs for most visits in the office.  - Discussed waterbirth as option for low-risk pregnancy.  Reviewed conditions that may arise during pregnancy that will risk pt out of waterbirth including hypertension, diabetes, fetal growth restriction <10%ile, etc.  2. Diet controlled gestational diabetes mellitus (GDM), antepartum - Not checking sugars consistently, but those are are checked are mostly within range. Encouraged pt to check blood sugars 4 x per day. Asked how she preferred to document. Paper logs given per pt request.   3. [redacted] weeks gestation of pregnancy - GBS NV  4. Vaginal discharge during pregnancy in third trimester - Aptima   Preterm labor symptoms and general obstetric precautions including but not limited to vaginal bleeding, contractions, leaking of fluid and fetal movement were reviewed in detail with the patient. Please refer to After Visit Summary for other counseling recommendations.   No follow-ups on file.  Future Appointments  Date Time Provider Department Center  12/22/2022  9:55 AM Kathlene Cote [facility]  01/01/2023  3:30 PM [facility] NURSE [facility]  01/01/2023  3:45 PM [facility] US1 [facility]    Dorathy Kinsman, CNM

## 2022-12-14 LAB — CERVICOVAGINAL ANCILLARY ONLY
Bacterial Vaginitis (gardnerella): NEGATIVE
Candida Glabrata: NEGATIVE
Candida Vaginitis: NEGATIVE
Comment: NEGATIVE
Comment: NEGATIVE
Comment: NEGATIVE

## 2022-12-22 ENCOUNTER — Ambulatory Visit (INDEPENDENT_AMBULATORY_CARE_PROVIDER_SITE_OTHER): Payer: Medicaid Other | Admitting: Medical

## 2022-12-22 ENCOUNTER — Other Ambulatory Visit (HOSPITAL_COMMUNITY)
Admission: RE | Admit: 2022-12-22 | Discharge: 2022-12-22 | Disposition: A | Discharge status: Home / Self Care | Payer: Medicaid Other | Source: Ambulatory Visit | Attending: Medical | Admitting: Medical

## 2022-12-22 ENCOUNTER — Encounter: Payer: Self-pay | Admitting: Medical

## 2022-12-22 ENCOUNTER — Other Ambulatory Visit: Payer: Self-pay

## 2022-12-22 VITALS — BP 103/61 | HR 92 | Wt 237.0 lb

## 2022-12-22 DIAGNOSIS — Z3A37 37 weeks gestation of pregnancy: Secondary | ICD-10-CM

## 2022-12-22 DIAGNOSIS — O403XX0 Polyhydramnios, third trimester, not applicable or unspecified: Secondary | ICD-10-CM

## 2022-12-22 DIAGNOSIS — O09899 Supervision of other high risk pregnancies, unspecified trimester: Secondary | ICD-10-CM

## 2022-12-22 DIAGNOSIS — O24410 Gestational diabetes mellitus in pregnancy, diet controlled: Secondary | ICD-10-CM | POA: Diagnosis not present

## 2022-12-22 NOTE — Patient Instructions (Signed)
[place] [facility] Providers  [place] ([address]) [facility] Brown, MD; Chambliss, MD; Eniola, MD; Hensel, MD; McDiarmid, MD; McIntyer, MD [address] [place], [place] [address] [phone] Mon-Fri 8:30-12:30, 1:30-5:00  Providers come to see babies during newborn hospitalization Only accepting infants of Mother's who are seen at Family Medicine Center or have siblings seen at   Family Medicine Center Medicaid - Yes; Tricare - Yes   [facility] Mulberry, MD [address] [place], [place] [address] [phone] Mon, Tue, Thur, Fri 8:30-5:00, Wed 10:00-7:00 (closed 1-2pm daily for lunch) Takes [place] residents with no insurance.  [place] [facility] only with Medicaid/insurance; Tricare - no  [facility] Ben-Davies, MD; Brown, MD; Chandler, MD; Ettefagh, MD; Grant, MD; Han[place], MD; Herrin, MD; Jones,  MD; Lester, MD; McCormick, MD; McQueen, MD; Simha, MD; Stanley, MD; Stryffeler, NP [address], [place], [place] [address] [phone] Mon, Tue, Thur, Fri 8:30-5:30, Wed 9:30-5:30, Sat 8:30-12:30 Only accepting infants of first-time parents or siblings of current patients Hospital discharge coordinator will make follow-up appointment Medicaid - yes; Tricare - yes  [place] ([address]) [facility] Cox, MD; Davis, MD; Dovico, MD; Ettefaugh, MD; Lowe, MD; Nation, MD; Slimp, MD; Sumner, MD; Williams, MD [address] [place] [place], [place] [address] [phone] Mon-Fri 8:30-5:00, closed for lunch 12:30-1:30; Sat-Sun 10:00-1:00 Accepting Newborns with commercial insurance only, must call prior to delivery to be accepted into  practice.  Medicaid - no, Tricare - yes   [facility] [address] [place], [place] [address] [phone] or [phone] Mon to Fri 8am to 10pm, Sat 8am to 1pm  (virtual only on weekends) Only accepts Medicaid Healthy Blue pts  [facility] [place]  Artis, MD; Coccaro, MD; [place] Gardner, MD; Netherton, NP; Roper, MD; Wilmot, PA-C; Skinner, MD [address] [place], [place] [address] [phone] Mon-Fri 8:30-5:30 Medicaid - yes, Tricare - yes  [place] ([address]) [facility] Warner, MD [address], [place], [place] [address] [phone] Mon, Tues, Wed Fri 8:30-5:00, Sat 8:30-12:00, Closed Thursdays Accepting siblings of established patients and first time mom's if you call prenatally Medicaid- yes; Tricare - yes  [facility] Becker, PA; Hagler, MD; Quinn, PA-C; Scifres, PA; Sun, MD; Swayne, MD;  [address], [place], [place] [address] [phone] Mon-Fri 8:30-5:00, closed for lunch 1-2 Only accepting newborns of established patients Medicaid- no; Tricare - yes  [place] ([address]) [facility] Timberlake, MD; [address], [place], [place] [address] [phone] Mon-Fri 8:00-5:00 Medicaid - No; Tricare - Yes  [facility] [employer]  Brake, NP; Wharton, PA [address], [place], [place] [address] [phone] Mon-Fri 8:00-5:00 Medicaid - No, Tricare - Yes  [facility] Gay, MD; Quinlan, MD; Blatt, DNP [address] [place], [place] [address] [phone]  Mon-Fri 8:00-5:00 Medicaid - No; Tricare - Yes  [facility] [address] [place], [place] [address] [phone] Mon-Fri 8:30-5:00 (lunch 12:00-1:00) Medicaid -Yes; Tricare - Yes  [facility] Jordan, MD [address], [place], [place] [address] [phone] Mon-Fri 8:00-5:00 Seeing newborns of current patients only. No new patients Medicaid - No, Tricare - yes  [facility] Parker, MD [address] [place], [place] [address] [phone] Mon-Fri 8:00-5:00 Medicaid -yes as secondary coverage only;   Tricare - yes  [facility] Brecken, PA; Christy, NP; Dees, MD; DeClaire, MD; DeWeese, MD; Hodge, PA; Smoot, NP; Summer, MD; Vapne, MD [address] [place], [place] [address] [phone] Mon-Fri 8:30-5:00, Sat 9:00-11:00 Accepts commercial insurance ONLY. Offers free prenatal information sessions for families. Medicaid - No, Tricare - Call first  [facility] Bouska, MD; Gordon, PA; Jeffery, PA; Weber, PA [address] [place] [address] [phone] Mon-Fri 7:30-5:30 Medicaid - Yes; Tricare - yes  [place] ([address] & [address])  [facility] Reese, MD [address] [place], [place] [address] [phone] Mon-Thur 8:00-6:00, closed for lunch 12-2, closed Fridays Medicaid - yes; Tricare - no  [facility] Anderson, NP; Badger, MD; Beal, PA; [place], PA [address], [place], [place] [address] [phone] Mon-Fri 7:30-4:30 Medicaid - yes, Tricare - yes  [facility]  Agbuya, MD; Klett, NP; Romgoolam, MD; Rothstein, NP [address], [place], [place] [address] [phone] Mon-Fri 8:30-5:00, closed for lunch 1-2, Sat 8:30-12:00 - sick visits only Providers come to see babies at [facility] Only accepting newborns of siblings and first time parents ONLY if who have met with office prior to delivery Medicaid -Yes; Tricare - yes  [facility] [place]  Golden, DO; Friddle, NP; Wallace, MD; Wood, MD:  [address], [place], [place] [address] [phone] Mon- Fri 8:00-5:00, Sat 9:00-12:00 - sick visits only Accepting siblings of established patients and first time mom/baby Medicaid - Yes; Tricare - yes Patients must have vaccinations (baby vaccines)  [place] ([address] &  [address])  [facility] [place] [facility] [address] [place], [place] [address] [phone] Mon-Fri 8:00-5:00 Medicaid - no; Tricare - yes  [facility] Briscoe, MD; Schmidt, PA; Moreira, PA [address], [place], [place] [address] [phone] Mon-Fri 8:00-5:00 Medicaid- yes; Tricare - yes  [facility] Boyd, MD; Jones, NP; Osborn, PA [address], , [place] [address] [phone] Mon-Fri 8:00-5:00 Medicaid - Yes; Tricare - yes  [place] ([address])  [facility] Atkinson, PA; Calderon, PA; Cummings, MD; Dillard, MD; Henrish, NP; Isenhour, DO; Martin, PA; Olson, MD; Ott, MD; Phillips, MD; Valente, PA; VanDeven, PA; Yonjof, NP [address], [place], [place] [address] [phone] Mon-Fri 8:30-5:00, Sat 9:00-12:00 - sick only Please register online [email or web address] then schedule online or call office Medicaid-Yes; Tricare -yes  [facility]  Dabrusco, MD; Dial, MD; [place], MD; Fleenor, NP; Goolsby, PA; Tonuzi, MD; Turner, NP; West, MD [address], [place], [place] [address] [phone] Mon-Fri 8:00-5:30, Sat&Sun by appointment (phones open at 8:30) Medicaid - Yes; Tricare - yes  [place] ([address]) [facility] Allen, CPNP; Bates, MD; Gordon, MD; Mills, NP; Weinshilboum, DO [address], [place], [place] [address] [phone] M-F 8:00 - 5:15, Sat/Sun 9-12 sick visits only Medicaid - No; Tricare - yes  [facility] [place] [facility]  Brown, PA-C; Cowen, PA-C; Dennis, DO; Fuster, PA-C; Martin, PA-C; Shelton, PA-C; Spry, MD [address] [place],  [address] [phone] Mon-Thur 8:00-7:00, Fri 8:00-5:00 Accepting Medicaid for 13 and under only   [facility] [place] [facility] [place] Hayes, FNP; List, FNP; Moran, MD; Pitonzo, PA-C; Scholer,   MD; Spangle, FNP; Nzenwa, FNP; Jasper, MD; Moran, MD [address] [place], [place] [address] [phone] Mon-Fri 8:30-5:30 Medicaid - Yes; Tricare - yes  [facility] [address]  Kelly, CPNP; Logan, MD; Poth, MD; Ramadoss, MD; Staton, NP [address], [place], [place] [address] [phone] Mon-Thur 8:00-5:30, Fri 8:00-5:00, Sat 9:00-12:00 Medicaid - yes, Tricare - yes  [place] ([address])  [facility] [place] Masneri, DO; Meyers, MD; Nelson, PA [address] [place] [address], [place], [place] [address] [phone] Mon-Fri 8:00-5:00, closed for lunch 12-1 Medicaid - No; Tricare - yes  [facility] [place] McGowen, MD [address], [place], [place] [address] [phone] Mon-Fri 8:00-5:00 Medicaid - No; Tricare - yes  [facility] [place] MacDonald, MD; Nayak, MD; Kearns, MD; Jones, MD [address] [place] [address], [place], [place] [address] [phone] Mon-Fri 8:00-5:00 Medicaid- Yes; Tricare - yes  [place] ([address])  [facility] [place] Martin, PA-C; Tabori, MD [address], [place], [place] [address] [phone] Mon-Fri 8:00-5:00 Medicaid - No; Tricare - yes  [facility] [place]  Margin - CPNP [address], [place], [place] [address] [phone] Mon-Weds 8:00-6:00, Thurs-Fri 8:00-5:00, Sat 9:00-12:00 Medicaid - yes; Tricare - yes   [facility] [place] Aubuchon, MD; Brandon, PA [address] [place], [place] [address] [phone] Mon-Fri 8:00-5:00 Medicaid - yes; Tricare - yes  [place] [facility] Providers  [facility] [address], [place], [place] [address] [phone] M, Thur: 8am -8pm, Tues, Weds: 8am - 5pm; Fri: 8-1 Medicaid - Yes; Tricare -  yes  [facility] Mertz, MD; Johnson, MD; Wells, MD; Downs, PA; Hockenberger, PA [address], [place], [place] [address] [phone] M-F 8:30 - 5:00 Medicaid - Call office; Tricare -yes  [facility] Bonney, MD; Page, MD, Minter, MD; Mueller, PNP; Thomason, NP [address], [place], [place] [address] [phone] M-F 8:30 - 5:00, Sat/Sun 8:30 - 12:30 (sick visits) Medicaid - Call office; Tricare -yes  [facility] Lewis, MD; Shaub, PNP; Boylston, MD; Quaile, PA; Nonato, NP; Landon, CPNP [address], [place], [place] [address] [phone] M-F 8:30 - 5:00 Medicaid - Call office; Tricare - yes  [facility] [place] Cline, MD; Dvergsten, MD; Flores, MD; Kawatu, MD; Nogo, MD [address], [place], [place] [address] [phone] M-Thur: 8:00 - 5:00; Fri: 8:00 - 4:00 Medicaid - yes; Tricare - yes  [facility] [address] [place], [place], [place] [address] [phone] M-F: 8:30- 5:00, closed for lunch 12:30 - 1:00 Medicaid - yes; Tricare -yes  [facility] [place] [address], [place], [place] [address] [phone] M-F 8:00 - 5:00 Medicaid - yes; Tricare - yes  [facility] Johnson, DO; Rumball, DO; Wicker, NP [address] [place], [place], [place] [address] [phone] M-F 8:00 - 5:00, Closed 12-1 for lunch Medicaid - Call; Tricare - yes  [facility] Stein, MD [address], [place], [place] [address] [phone] M-F: 8:00-5:00, Sat: 8:00 - noon Medicaid - call; Tricare -yes  [place] [facility] Providers  [facility] Collins, FNP-C [address], [place],  [address] [phone] M-W: 8:00-5:00, Thur: 8:00 - 7:00, Fri: 8:00 - noon Medicaid - yes; Tricare - yes  [facility] [place] Adams, FNP [address], [place],   [place] [address] [phone] M-F 8:00 - 5:00, Closed for lunch 12-1 Medicaid -  yes; Tricare - yes  [place] [facility] Providers  [facility] Smith, FNP, Melvin, MD, Fay, FNP-C [address] [place] [address] [place], [place], [place] [address] [phone] M-T 8:00-5:00, Wed-Fri 7:00-6:00 Medicaid - Yes; Tricare -yes  [facility] [place] Civiletti, DO; [address], [place], [place] [address] [phone] M-F 8:00 - 5:00, closed for lunch 12-1 Medicaid - Yes; Tricare - yes  [facility]  Barnes, MD; Bergdolt, MD; Caulfield, MD; Emrich, MD; Fiscus, MD; Hoppens, MD; Kylstra, MD, McPherson, MD; Todd, MD; Prestwood, MD; Waters, MD; Wood, MD [address], [place], [place] [address] [phone] M-F 8:00-5:00 Medicaid - yes; Tricare - yes  [facility] Cheema, MD (speaks Punjabi and Hindi) [address] [place], [place] [address] [phone] M-F: 8:30 - 5:00, closed 12:30 - 1 for lunch Medicaid - Yes; Tricare -yes  [place] [facility] Providers  [facility] Loda, MD; Timberlake, MD; Burke, MD [address], [place], [place] [address] [phone] M-Th: 8:00 - 5:30, Fri: 8:00 - 12:00 Medicaid - yes; Tricare - yes  [facility] [place] Lookabill, NP; Meier, MD; Daffron, MD [address], [place], [place] [address] [phone] M-F: 8:00 - 5:00 Medicaid - yes; Tricare - yes  [facility] Busse, NP; Bowman, NP; Baune, NP; Entwistle, MD; Williams, MD, Huffman, NP, Ferguson, MD; Patel, DO [address], [place], [place] [address] [phone] M-F: 8:30 - 5:30p Medicaid - yes; Tricare - yes Other locations available as well  [facility] [facility] Rajan, MD; Wilson, MD; Morgan, PA-C, Domenech, PA-C; Myers, PA-C [address], [place], [place] [address] [phone] M-W: 8:00am - 7:00pm, Thurs: 8:00am - 8:00pm; Fri: 8:00am -  5:00pm, closed daily from 12-1 for lunch Medicaid - yes; Tricare - yes  [place] [facility] Providers  [facility] [place] Adams, MD; Crystal, FNP; Hadley, MD; Stokes, MD; Johnson, PNP; Brady, PA-C; West, PNP; Gardner, MD;  [address], [place], [place] [address] [phone] M - Fri: 8am - 5pm, Sat 9-noon Medicaid - Yes; Tricare -yes  [facility] [place] Nayak, MD; Jones, FNP; McDonald, MD; Kearns, MD [address] [place] [address], [place], [place] [phone] M-F 8:00 - 5:00 Medicaid - call; Tricare - yes  [facility] [place] Bell, MD; Emory, PNP; Pinder, MD; Anderson, MD; Light, PA-C; Johnson, MD; Latta, MD; Saul, PNP; Rainey, MD; Clifford, MD; McClung, MD [address], [place], [place] [address] [phone] M-F 8:00am - 5:00pm; Sat. 9:00 - 11:00 Medicaid - yes; Tricare - yes  [facility] [place] Soldato-Couture, MD [address], [place], [place] [address] [phone] M-F 8:00 - 5:00 Medicaid - [place] Medicaid only; Tricare - yes  [facility] [place] Walker, MD; Davis, PNP; Ajizian, MD [address] [place] [address], [place], [place] [address] [phone] M-F 8:00 - 5:00 Medicaid - yes; Tricare - yes  [facility] [place] Barry, MD; Brown, MD, Forest, MD, Hazek, MD; Hoyle, MD; Smith, MD; [address] [place], [place], [place] [address] [phone] M-F: 8-5 Medicaid - yes; Tricare - yes  [facility] [place] Brady, Md; Dowlen, MD; [address] [place] [address], [place], [place] [address] [phone] M-F 8-5 Medicaid - yes; Tricare - yes  [facility] Kearns, MD; Nayak, MD;   [place]-Courture, MD; Pellam-Palmer, DNP; Herring, PNP [address], [place], [place] [address] [phone] M-F 8-5 Medicaid - yes; Tricare - yes  [facility] Weathers, MD;  Merritt, PA-C; Davis-PA-C; Warnimont, MD [address], [place], [place] [address] [phone] M-F 7am - 5 pm Medicaid - call; Tricare - yes  [facility] Hill, PNP; Erickson, MD; Robinson, MD [address], [place], [place] [address] [phone] M-F 8-5 Medicaid - yes; Tricare - yes  [facility] Kribbs, MD; Warner, MD; Williams, FNP; Brooks, FNP; Boles, FNP; Romblad, PA-C; [place] - FNP [address], [place], [place] [address] [phone] M-F 8-5 Medicaid- yes; Tricare - yes  [facility] [place] [place] Yoder, MD; Verenes, MD; Armentrout, MD; Stewart, MD; Beasley, CPNP; Ford, MD; Erickson, MD; Rice, MD [address], [place], [place] [address] [phone] M-F: 8-5, Sat: 9-4, Sun 9-12 Medicaid - yes; Tricare - yes  [facility] Little, PNP; Davis, PNP [address], [place], [place] [address] [phone] M-F 8 - 5, closed 12-1 for lunch Medicaid - yes; Tricare - yes  [facility] Friesen, MD; Cnegia, MD; Rice, MD; Patel, DO [address], [place], [place] [address] [phone] M-F 8- 5:30 Medicaid - yes; Tricare - yes  [facility] [place] Zvolensky, MD; Ray, MD; Haas, MD [address] [address], [place], [place] [address] [phone] M: 8-7; Tues-Fri: 8-5; Sat: 9-12 Medicaid - yes; Tricare - yes  [facility] [place] Heinrich, MD; Meyer, MD; Clark, MD; Rhyne, MD; Aubuchon, MD [address], [place], [place] [address] [phone] M: 8-7; Tues-Fri: 8-5; Sat: 8:30-12:30 Medicaid - yes; Tricare - yes  [facility] [place] Bista, MD; Dillard, PA [address], [place], [place] [address] [phone] Mon-Fri: 8-5 Medicaid - yes;  Tricare - yes  [facility] [place] Beasley, CPNP; Mahle, CPNP; Rice, MD; Duffy, MD; Culler, MD; [address], [place], [place] [address] [phone] M-F: 8-5, closed 1-2 for lunch Medicaid - yes; Tricare - yes  [facility] Rickman, PA; Mounce, NP; Smith, MD; Jordan, CPNP; Darty, PA; Ball, MD; Wallace, MD [address] [place] [address], [place], [place] [address] [phone] M-Thurs: 8-7; Fri: 8-6; Sat: 9-12; Sun 2-4 Medicaid - yes; Tricare - yes  [facility] Brown, MD; Shin, MD; Goodman, DNP, FNP; Sebesta, DO; [address], [place], [place] [address] [phone] M-F: 8-5 Medicaid - yes; Tricare - yes  [place] [facility] Providers  [facility] Dough, MD; Welsh, NP [address], [place], [place] [address] [phone] M - Fri: 8am - 5pm, closed for lunch 12-1 Medicaid - Yes; Tricare - yes   [facility] Manandhar, MD; Riley, MD; Sanger, DO; Vinocur, MD;Hall, PA; Walsh, PA; Campbell, NP [address], [place] [address] [phone] M-F 8:00 - 5:00, Sat 8:00 - 11:30 Medicaid - yes; Tricare - yes  [facility] Khan, MD; Redding, MD, Street, MD, Holt, MD, Burgart, MD; Rhyne, NP; Dickinson, PA;  [address] [place] [address], [place], [place] [address] [phone] M-F 8:10am - 5:00pm Medicaid - yes; Tricare - yes    [facility] Connors, MD; Kime, NP [address], [place], [place] [address] [phone] M-F 8:00 - 5:00 Medicaid - [place] Medicaid only; Tricare - yes  [facility] [place] Whyte, MD; Fox, NP [address], [place], [place] [address] [phone] M-F 8:00 - 5:00; Closed for lunch 12 - 1:00 Medicaid - yes;  Tricare - yes  [facility] Penner, MD; Wilburn, FNP [address], [place], [place] [address] [phone] Mon 9-5; Tues/Wed 10-5; Thurs 8:30-5; Fri: 8-12:30 Medicaid - yes; Tricare - yes  [place] [facility]  [facility]  Golding, MD; Jackson, PA-C [address], [place], [place] [address] [phone] phone [phone] fax M-F 7:15 - 4:30 Medicaid - yes; Tricare - yes  [facility] Gosrani, MD; Meccariello, DO [address] , [place] [address] [phone] M-Fri: 8:30 - 5:00, closed for lunch everyday noon - 1pm Medicaid - Yes; Tricare - yes  [facility] Burdine, MD; Daniel, MD; Howard, MD; Sasser, MD; Boles, PA; Boyd, PA-C; Carroll, PA; McGee, PA; Skillman, PA; Wilson, PA [address] [place], [place] [address] [phone] M-Thurs: 7:30am - 7:00pm; Friday 7:30am - 4pm; Sat: 8:00 - 1:00 Medicaid - Yes; Tricare - yes  [facility] [place] Akhbari, MD; Law, MD; Qayumi, MD; Salvador, DO [address], [place], [place] [address] [phone] M-Thur: 8:00 - 5:00, Fri: 8:00 - Noon Medicaid - yes; Tricare - yes No [place] [employer]  [facility] Dettinger, MD; Gottschalk, DO; Hawks, NP; Martin, NP; Morgan, NP; Milian, NP; Rakes, NP; Stacks, MD; Webster, PA [address], [place], [place] [address] [phone] M-F 8:00 - 5:00 Medicaid - yes; Tricare - yes  [facility] Collins, FNP-C; Bucio, FNP-C [address], [place], [place] [address] [phone] M, W, R 8:00-5:00, Tues: 8:00am - 7:00pm; Fri 8:00 - noon Medicaid - Yes; Tricare - yes  [facility] Khan, MD [address] [place] [address] [place], [place] [address] [phone]  M-Thurs 8:30-5:30, Fri: 8:30-12:30pm Medicaid - Yes; Tricare - N  

## 2022-12-22 NOTE — Progress Notes (Signed)
   PRENATAL VISIT NOTE  Subjective:  Alexis Caldwell is a 37 y.o. [id number] at [redacted]w[redacted]d being seen today for ongoing prenatal care.  She is currently monitored for the following issues for this high-risk pregnancy and has Lumbar radiculopathy; History of gestational diabetes; Depression, unspecified; Gestational diabetes mellitus (GDM), antepartum; Supervision of other high risk pregnancy, antepartum; and Polyhydramnios affecting pregnancy in third trimester on their problem list.  Patient reports fatigue.  Contractions: Not present. Vag. Bleeding: None.  Movement: Present. Denies leaking of fluid.   The following portions of the patient's history were reviewed and updated as appropriate: allergies, current medications, past family history, past medical history, past social history, past surgical history and problem list.   Objective:   Vitals:   12/22/22 1014  BP: 103/61  Pulse: 92  Weight: 237 lb (107.5 kg)    Fetal Status: Fetal Heart Rate (bpm): 159   Movement: Present     General:  Alert, oriented and cooperative. Patient is in no acute distress.  Skin: Skin is warm and dry. No rash noted.   Cardiovascular: Normal heart rate noted  Respiratory: Normal respiratory effort, no problems with respiration noted  Abdomen: Soft, gravid, appropriate for gestational age.  Pain/Pressure: Present     Pelvic: Cervical exam performed in the presence of a chaperone       3.5cm/50%/-3  Extremities: Normal range of motion.  Edema: Trace  Mental Status: Normal mood and affect. Normal behavior. Normal judgment and thought content.   Assessment and Plan:  Pregnancy: [id number] at [redacted]w[redacted]d 1. Supervision of other high risk pregnancy, antepartum - Strep Gp B Culture+Rflx - GC/Chlamydia probe amp ([place])not at [facility] - Planning [facility] for [facility] - Has Peds list   2. Diet controlled gestational diabetes mellitus (GDM), antepartum - Patient has fasting values and few PP values for the last week, most of  which are elevated.  - Patient admits some values are not accurate due to late night snacking and difficulty sleeping  - Last US shows polyhydramnios - Discussed with Dr. Crissie Reese, plan for IOL this week due to poorly controlled GDM as noted by few CBGs in range and polyhydramnios on [place]  - Advised that this would risk her out of safe WB, patient is disappointed but understanding   3. Polyhydramnios affecting pregnancy in third trimester  4. [redacted] weeks gestation of pregnancy  Term labor symptoms and general obstetric precautions including but not limited to vaginal bleeding, contractions, leaking of fluid and fetal movement were reviewed in detail with the patient. Please refer to After Visit Summary for other counseling recommendations.   Return in about 1 week (around 12/29/2022) for LOB, Midwife preferred, In-Person.  Future Appointments  Date Time Provider Department Center  01/01/2023  3:30 PM [facility] NURSE [facility]  01/01/2023  3:45 PM [facility] US1 [facility]    Vonzella Nipple, PA-C

## 2022-12-22 NOTE — Progress Notes (Signed)
IOL scheduled for  

## 2022-12-23 ENCOUNTER — Other Ambulatory Visit: Payer: Self-pay | Admitting: Advanced Practice Midwife

## 2022-12-23 ENCOUNTER — Other Ambulatory Visit: Payer: Self-pay | Admitting: Family Medicine

## 2022-12-23 DIAGNOSIS — O09899 Supervision of other high risk pregnancies, unspecified trimester: Secondary | ICD-10-CM

## 2022-12-23 DIAGNOSIS — O24410 Gestational diabetes mellitus in pregnancy, diet controlled: Secondary | ICD-10-CM

## 2022-12-23 LAB — GC/CHLAMYDIA PROBE AMP (~~LOC~~) NOT AT ARMC
Chlamydia: NEGATIVE
Comment: NEGATIVE
Comment: NORMAL
Neisseria Gonorrhea: NEGATIVE

## 2022-12-24 ENCOUNTER — Inpatient Hospital Stay (HOSPITAL_COMMUNITY)
Admission: RE | Admit: 2022-12-24 | Discharge: 2022-12-26 | DRG: 807 | Disposition: A | Discharge status: Home / Self Care | Payer: Medicaid Other | Attending: Family Medicine | Admitting: Family Medicine

## 2022-12-24 ENCOUNTER — Inpatient Hospital Stay (HOSPITAL_COMMUNITY): Payer: Medicaid Other

## 2022-12-24 ENCOUNTER — Encounter (HOSPITAL_COMMUNITY): Payer: Self-pay | Admitting: Family Medicine

## 2022-12-24 ENCOUNTER — Other Ambulatory Visit: Payer: Self-pay

## 2022-12-24 DIAGNOSIS — O24419 Gestational diabetes mellitus in pregnancy, unspecified control: Secondary | ICD-10-CM | POA: Diagnosis present

## 2022-12-24 DIAGNOSIS — F32A Depression, unspecified: Secondary | ICD-10-CM | POA: Diagnosis present

## 2022-12-24 DIAGNOSIS — Z3A37 37 weeks gestation of pregnancy: Secondary | ICD-10-CM

## 2022-12-24 DIAGNOSIS — O09899 Supervision of other high risk pregnancies, unspecified trimester: Secondary | ICD-10-CM

## 2022-12-24 DIAGNOSIS — O24420 Gestational diabetes mellitus in childbirth, diet controlled: Secondary | ICD-10-CM | POA: Diagnosis present

## 2022-12-24 DIAGNOSIS — O403XX0 Polyhydramnios, third trimester, not applicable or unspecified: Secondary | ICD-10-CM | POA: Diagnosis present

## 2022-12-24 DIAGNOSIS — Z87891 Personal history of nicotine dependence: Secondary | ICD-10-CM | POA: Diagnosis not present

## 2022-12-24 DIAGNOSIS — O24410 Gestational diabetes mellitus in pregnancy, diet controlled: Secondary | ICD-10-CM

## 2022-12-24 DIAGNOSIS — Z8632 Personal history of gestational diabetes: Secondary | ICD-10-CM | POA: Diagnosis present

## 2022-12-24 LAB — CBC
HCT: 33.9 % — ABNORMAL LOW (ref 36.0–46.0)
Hemoglobin: 12.4 g/dL (ref 12.0–15.0)
MCH: 32 pg (ref 26.0–34.0)
MCHC: 36.6 g/dL — ABNORMAL HIGH (ref 30.0–36.0)
MCV: 87.6 fL (ref 80.0–100.0)
Platelets: 327 10*3/uL (ref 150–400)
RBC: 3.87 MIL/uL (ref 3.87–5.11)
RDW: 12.8 % (ref 11.5–15.5)
WBC: 15.2 10*3/uL — ABNORMAL HIGH (ref 4.0–10.5)
nRBC: 0 % (ref 0.0–0.2)

## 2022-12-24 LAB — GROUP B STREP BY PCR: Group B strep by PCR: NEGATIVE

## 2022-12-24 LAB — GLUCOSE, CAPILLARY
Glucose-Capillary: 96 mg/dL (ref 70–99)
Glucose-Capillary: 94 mg/dL (ref 70–99)
Glucose-Capillary: 92 mg/dL (ref 70–99)
Glucose-Capillary: 122 mg/dL — ABNORMAL HIGH (ref 70–99)

## 2022-12-24 LAB — TYPE AND SCREEN
ABO/RH(D): O POS
Antibody Screen: NEGATIVE

## 2022-12-24 MED ORDER — FENTANYL CITRATE (PF) 100 MCG/2ML IJ SOLN: 50.0000 ug | INTRAMUSCULAR | Status: DC | PRN

## 2022-12-24 MED ORDER — TERBUTALINE SULFATE 1 MG/ML IJ SOLN: 0.2500 mg | Freq: Once | INTRAMUSCULAR | Status: DC | PRN

## 2022-12-24 MED ORDER — OXYCODONE-ACETAMINOPHEN 5-325 MG PO TABS: 1.0000 | ORAL_TABLET | ORAL | Status: DC | PRN

## 2022-12-24 MED ORDER — OXYTOCIN BOLUS FROM INFUSION
333.0000 mL | Freq: Once | INTRAVENOUS | Status: AC
  Administered 2022-12-24: 333 mL via INTRAVENOUS

## 2022-12-24 MED ORDER — ONDANSETRON HCL 4 MG/2ML IJ SOLN: 4.0000 mg | Freq: Four times a day (QID) | INTRAMUSCULAR | Status: DC | PRN

## 2022-12-24 MED ORDER — OXYCODONE-ACETAMINOPHEN 5-325 MG PO TABS: 2.0000 | ORAL_TABLET | ORAL | Status: DC | PRN

## 2022-12-24 MED ORDER — OXYTOCIN-SODIUM CHLORIDE 30-0.9 UT/500ML-% IV SOLN: 2.5000 [IU]/h | INTRAVENOUS | Status: DC

## 2022-12-24 MED ORDER — SOD CITRATE-CITRIC ACID 500-334 MG/5ML PO SOLN: 30.0000 mL | ORAL | Status: DC | PRN

## 2022-12-24 MED ORDER — LIDOCAINE HCL (PF) 1 % IJ SOLN: 30.0000 mL | INTRAMUSCULAR | Status: DC | PRN

## 2022-12-24 MED ORDER — OXYTOCIN-SODIUM CHLORIDE 30-0.9 UT/500ML-% IV SOLN
1.0000 m[IU]/min | INTRAVENOUS | Status: DC
  Administered 2022-12-24: 2 m[IU]/min via INTRAVENOUS
  Filled 2022-12-24: qty 500

## 2022-12-24 MED ORDER — LACTATED RINGERS IV SOLN: 500.0000 mL | INTRAVENOUS | Status: DC | PRN

## 2022-12-24 MED ORDER — FENTANYL CITRATE (PF) 100 MCG/2ML IJ SOLN: 100.0000 ug | INTRAMUSCULAR | Status: DC | PRN

## 2022-12-24 MED ORDER — LACTATED RINGERS IV SOLN: INTRAVENOUS | Status: DC

## 2022-12-24 MED ORDER — ACETAMINOPHEN 325 MG PO TABS: 650.0000 mg | ORAL_TABLET | ORAL | Status: DC | PRN

## 2022-12-24 NOTE — Discharge Summary (Signed)
Postpartum Discharge Summary      Patient Name: Alexis Caldwell DOB: 1985/09/10 MRN: [id number]  Date of admission: 12/24/2022 Delivery date:12/24/2022  Delivering provider: Jacklyn Shell  Date of discharge: 12/26/2022  Admitting diagnosis: Gestational diabetes, polyhydramnios (mild)  Intrauterine pregnancy: [redacted]w[redacted]d     Secondary diagnosis:  Active Problems:   History of gestational diabetes   Depression, unspecified   Gestational diabetes mellitus (GDM), antepartum   Supervision of other high risk pregnancy, antepartum   Polyhydramnios affecting pregnancy in third trimester   Vaginal delivery  Additional problems: none    Discharge diagnosis: Term Pregnancy Delivered and GDM A1                                              Post partum procedures: None Augmentation: AROM and Pitocin Complications: None  Hospital course: Induction of Labor With Vaginal Delivery   37 y.o. yo [id number] at [redacted]w[redacted]d was admitted to the hospital 12/24/2022 for induction of labor.  Indication for induction: A1 DM.  Patient had an labor course complicated by nothing Membrane Rupture Time/Date: 8:28 PM ,12/24/2022   Delivery Method:Vaginal, Spontaneous  Episiotomy: None  Lacerations:  None  Details of delivery can be found in separate delivery note.  Patient had a uncomplicated postpartum course. Patient is discharged home 12/26/22.  Newborn Data: Birth date:12/24/2022  Birth time:10:05 PM  Gender:Female  Living status:Living  Apgars:8 ,9  Weight:3300 g   Magnesium Sulfate received: No BMZ received: No Rhophylac:N/A MMR:N/A T-DaP: ordered postpartum Flu: N/A Transfusion:No  Physical exam  Vitals:   12/25/22 1300 12/25/22 1435 12/25/22 2005 12/26/22 0439  BP: 137/76 118/68 124/61 110/68  Pulse: 86 85 80 78  Resp: 17  18 18   Temp: 98 F (36.7 C)  97.9 F (36.6 C) 98.2 F (36.8 C)  TempSrc: Oral  Oral Oral  SpO2:   99% 98%  Weight:      Height:       General: alert, cooperative,  and no distress Lochia: appropriate Uterine Fundus: firm Incision: N/A DVT Evaluation: No evidence of DVT seen on physical exam. Labs: Lab Results  Component Value Date   WBC 15.2 (H) 12/24/2022   HGB 12.4 12/24/2022   HCT 33.9 (L) 12/24/2022   MCV 87.6 12/24/2022   PLT 327 12/24/2022      Latest Ref Rng & Units 02/07/2022    3:38 PM  CMP  Glucose 70 - 99 mg/dL 130   BUN 6 - 20 mg/dL 12   Creatinine 8.65 - 1.00 mg/dL 7.84   Sodium 696 - 295 mmol/L 137   Potassium 3.5 - 5.1 mmol/L 3.9   Chloride 98 - 111 mmol/L 107   CO2 22 - 32 mmol/L 22   Calcium 8.9 - 10.3 mg/dL 9.7   Total Protein 6.5 - 8.1 g/dL 7.6   Total Bilirubin 0.3 - 1.2 mg/dL 0.4   Alkaline Phos 38 - 126 U/L 45   AST 15 - 41 U/L 16   ALT 0 - 44 U/L 13    Edinburgh Score:    12/25/2022    1:30 AM  Edinburgh Postnatal Depression Scale Screening Tool  I have been able to laugh and see the funny side of things. 0  I have looked forward with enjoyment to things. 0  I have blamed myself unnecessarily when things went wrong. 1  I have  been anxious or worried for no good reason. 2  I have felt scared or panicky for no good reason. 0  Things have been getting on top of me. 1  I have been so unhappy that I have had difficulty sleeping. 0  I have felt sad or miserable. 0  I have been so unhappy that I have been crying. 0  The thought of harming myself has occurred to me. 0  Edinburgh Postnatal Depression Scale Total 4     After visit meds:  Allergies as of 12/26/2022       Reactions   Penicillins         Medication List     TAKE these medications    Accu-Chek Guide test strip Generic drug: glucose blood TEST STRIPS TO CHECK BLOOD SUGARS 4 TIMES A DAY   Accu-Chek Softclix Lancets lancets SMARTSIG:Lancet(s) Topical 4 Times Daily   acetaminophen 325 MG tablet Commonly known as: Tylenol Take 2 tablets (650 mg total) by mouth every 4 (four) hours as needed (for pain scale < 4). What changed:  when to  take this reasons to take this   ferrous sulfate 325 (65 FE) MG tablet Take 1 tablet (325 mg total) by mouth every other day. Start taking on: Dec 27, 2022   ibuprofen 600 MG tablet Commonly known as: ADVIL Take 1 tablet (600 mg total) by mouth every 6 (six) hours.   norethindrone 0.35 MG tablet Commonly known as: Ortho Micronor Take 1 tablet (0.35 mg total) by mouth daily.   prenatal multivitamin Tabs tablet Take 1 tablet by mouth daily at 12 noon.         Discharge home in stable condition Infant Feeding: Breast Infant Disposition:home with mother Discharge instruction: per After Visit Summary and Postpartum booklet. Activity: Advance as tolerated. Pelvic rest for 6 weeks.  Diet: routine diet Future Appointments: Future Appointments  Date Time Provider Department Center  01/21/2023  8:20 AM [facility]  01/21/2023  9:15 AM Lorriane Shire, MD [facility]   Follow up Visit:   Please schedule this patient for a In person postpartum visit in 4 weeks with the following provider: Any provider. Additional Postpartum F/U:2 hour GTT  Low risk pregnancy complicated by: GDM Delivery mode:  Vaginal, Spontaneous  Anticipated Birth Control:  POPs   12/26/2022 Celedonio Savage, MD

## 2022-12-24 NOTE — H&P (Signed)
HPI: Alexis Caldwell is a 37 y.o. year old [id number] female at [redacted]w[redacted]d weeks gestation who presents to L&D for IOL for uncontrolled A1GDM and polyhydramnios. Got prenatal care at [facility] and transferred care at 35 weeks to [facility] for [employer].   [place] 12/03/22 Est. FW: 2498 gm 5 lb 8 oz 53 %   NURSING  PROVIDER  Office Location [facility] Dating by LMP c/w U/S at 10 wks  [facility] Model Traditional Anatomy U/S  Done with [facility] on 09/07/2022  Initiated care at  10wks                Language  English              LAB RESULTS   Support Person Spouse Genetics NIPS: low risk female   AFP: Negative                NT/IT (FT only)     Carrier Screen Horizon: Negative  Rhogam  O/Positive/-- (10/30 0000) A1C/GTT Early:  Hgb A1C 5.3% (06/15/22)           Third trimester: GDM -Fasting: 103 -Glucose 1 hr: 231 -Glucose 2 hr: 165 -Glucose 3 hr: 129  Flu Vaccine     TDaP Vaccine  Declined  Blood Type O/Positive/-- (10/30 0000)  Covid Vaccine Not received  Antibody Negative (10/30 0000)    Rubella Immune (10/30 0000)  Feeding Plan breast RPR Nonreactive (10/30 0000)  Contraception Yes, POPs HBsAg Negative (10/30 0000)  Circumcision NA HIV Neg  Pediatrician  List given  HCVAb Negative (10/30 0000)  Prenatal Classes Info Given       Pap Nml 2023  BTLConsent  GC/CT Initial:             36wks: -/-  VBAC  Consent NA GBS Pending For        DME Rx [ ]  BP cuff [ ]  Weight Scale Waterbirth  Risked out  PHQ9 & GAD7 [  ] new OB [  ] 28 weeks  [  ] 36 weeks Induction  [ ]  Orders Entered [ ] Foley Y/N     OB History     Gravida  4   Para  2   Term  2   Preterm      AB  1   Living  2      SAB  1   IAB  0   Ectopic      Multiple      Live Births  2          Past Medical History:  Diagnosis Date   Anxiety    Depression    Gestational diabetes    Past Surgical History:  Procedure Laterality Date   NO PAST SURGERIES     Family History: family history includes Diabetes  in her father and mother; Heart disease in her maternal aunt, maternal grandfather, and mother; Hypertension in her mother; Obesity in her maternal grandfather; Stroke in her mother. Social History:  reports that she quit smoking about 8 months ago. Her smoking use included cigarettes. She has never used smokeless tobacco. She reports that she does not currently use alcohol. She reports that she does not use drugs.     Maternal Diabetes: Yes:  Diabetes Type:  Diet controlled uncontrolled Genetic Screening: Normal Maternal Ultrasounds/Referrals: Other: Fetal Ultrasounds or other Referrals:  Referred to Materal Fetal Medicine  Maternal Substance Abuse:  No Significant Maternal Medications:  None Significant Maternal Lab Results:  Other:  Number of Prenatal Visits:greater than 3 verified prenatal visits Other Comments:   Uncontrolled GDM, Polyhydramnios, GBS pending  Review of Systems  Constitutional:  Negative for chills and fever.  Eyes:  Negative for photophobia.  Gastrointestinal:  Negative for abdominal pain, nausea and vomiting.  Genitourinary:  Negative for vaginal bleeding and vaginal discharge.       Neg for LOF  Neurological:  Negative for headaches.   Maternal Medical History:  Reason for admission: Nausea. IOL for uncontrolled GDM, Poly  Contractions: Frequency: rare.   Perceived severity is mild.   Fetal activity: Perceived fetal activity is normal.   Prenatal complications: Polyhydramnios.   No PIH or IUGR.   Prenatal Complications - Diabetes: gestational. Diabetes is managed by diet.     Dilation: 4 Effacement (%): 70 Exam by:: Dorathy Kinsman, CNM Blood pressure 121/71, pulse (!) 110, temperature 98.2 F (36.8 C), temperature source Oral, resp. rate 16, height 5\' 2"  (1.575 m), weight 107.1 kg, last menstrual period 04/06/2022. Maternal Exam:  Uterine Assessment: Contraction strength is mild.  Contraction frequency is rare.  Abdomen: Patient reports no abdominal  tenderness. Estimated fetal weight is 7-4.   Fetal presentation: vertex Introitus: Normal vulva. Normal vagina.  Pelvis: adequate for delivery.   Cervix: Cervix evaluated by digital exam.     Fetal Exam Fetal Monitor Review: Baseline rate: 135.  Variability: moderate (6-25 bpm).   Pattern: accelerations present and no decelerations.   Fetal State Assessment: Category I - tracings are normal.   Physical Exam Constitutional:      General: She is not in acute distress.    Appearance: She is obese. She is not toxic-appearing.  HENT:     Head: Normocephalic.  Eyes:     Conjunctiva/sclera: Conjunctivae normal.  Cardiovascular:     Rate and Rhythm: Normal rate.  Pulmonary:     Effort: Pulmonary effort is normal. No respiratory distress.  Abdominal:     Palpations: Abdomen is soft.     Tenderness: There is no abdominal tenderness.  Genitourinary:    General: Normal vulva.  Musculoskeletal:     Right lower leg: No edema.     Left lower leg: No edema.  Skin:    General: Skin is warm and dry.  Neurological:     General: No focal deficit present.     Mental Status: She is alert and oriented to person, place, and time.  Psychiatric:        Mood and Affect: Mood normal.     Prenatal labs: ABO, Rh: --/--/O POS (05/09 1436) Antibody: NEG (05/09 1436) Rubella: Immune (10/30 0000) RPR: Nonreactive (10/30 0000)  HBsAg: Negative (10/30 0000)  HIV: Non-reactive (02/19 0000)  GBS:   Pending  Assessment: 1. Labor: IOL for Uncontrolled GDM, Poly 2. Fetal Wellbeing: Category I  3. Pain Control: Planned waterbirth but informed she is no longer a candidate. Strongly desire to avoid epidural due to what sound like a hypotensive episode with epidural w/ G2.. May labor in shower. Other comfort measures and pain management options reviewed.  4. GBS: Pending.  5. 37.3 week IUP 6. A1GDM, uncontrolled 7. Mild Polyhydramnios  Plan:  1. Admit to BS per consult with MD 2. Routine L&D  orders 3. Analgesia/anesthesia PRN  4. GBS prophylaxis not indicated due to term, no Hx GBS Dz in prev deliveries. 5. CBGs Q2 hours  6. Caution w/ AROM due to Poly 7. Pitocin, AROM PRN  Dorathy Kinsman 12/24/2022, 4:04 PM

## 2022-12-24 NOTE — Progress Notes (Signed)
Patient Vitals for the past 4 hrs:  BP Temp Temp src Pulse  12/24/22 2030 114/69 -- -- (!) 107  12/24/22 2015 128/70 -- -- 96  12/24/22 1919 -- 98.1 F (36.7 C) Oral --  12/24/22 1915 118/72 -- -- (!) 105  12/24/22 1830 (!) 119/45 -- -- 95  12/24/22 1800 (!) 112/57 -- -- 94  12/24/22 1730 107/70 -- -- (!) 103   Not really feeling ctx, q 3-4 minutes.  FHR Cat 1.  Pit at 6 mu/min.  Cx 5/90/-2  AROM w/clear fluid. Continue present mgt.

## 2022-12-24 NOTE — Plan of Care (Signed)
  Problem: Education: Goal: Knowledge of General Education information will improve Description: Including pain rating scale, medication(s)/side effects and non-pharmacologic comfort measures Outcome: Progressing   Problem: Health Behavior/Discharge Planning: Goal: Ability to manage health-related needs will improve Outcome: Progressing   Problem: Clinical Measurements: Goal: Ability to maintain clinical measurements within normal limits will improve Outcome: Progressing Goal: Will remain free from infection Outcome: Progressing Goal: Diagnostic test results will improve Outcome: Progressing Goal: Respiratory complications will improve Outcome: Progressing Goal: Cardiovascular complication will be avoided Outcome: Progressing   Problem: Activity: Goal: Risk for activity intolerance will decrease Outcome: Progressing   Problem: Nutrition: Goal: Adequate nutrition will be maintained Outcome: Progressing   Problem: Coping: Goal: Level of anxiety will decrease Outcome: Progressing   Problem: Elimination: Goal: Will not experience complications related to bowel motility Outcome: Progressing Goal: Will not experience complications related to urinary retention Outcome: Progressing   Problem: Pain Managment: Goal: General experience of comfort will improve Outcome: Progressing   Problem: Safety: Goal: Ability to remain free from injury will improve Outcome: Progressing   Problem: Skin Integrity: Goal: Risk for impaired skin integrity will decrease Outcome: Progressing   Problem: Education: Goal: Knowledge of Childbirth will improve Outcome: Progressing Goal: Ability to make informed decisions regarding treatment and plan of care will improve Outcome: Progressing Goal: Ability to state and carry out methods to decrease the pain will improve Outcome: Progressing Goal: Individualized Educational Video(s) Outcome: Progressing   Problem: Coping: Goal: Ability to  verbalize concerns and feelings about labor and delivery will improve Outcome: Progressing   Problem: Life Cycle: Goal: Ability to make normal progression through stages of labor will improve Outcome: Progressing Goal: Ability to effectively push during vaginal delivery will improve Outcome: Progressing   Problem: Role Relationship: Goal: Will demonstrate positive interactions with the child Outcome: Progressing   Problem: Safety: Goal: Risk of complications during labor and delivery will decrease Outcome: Progressing   Problem: Pain Management: Goal: Relief or control of pain from uterine contractions will improve Outcome: Progressing   Problem: Education: Goal: Ability to describe self-care measures that may prevent or decrease complications (Diabetes Survival Skills Education) will improve Outcome: Progressing Goal: Individualized Educational Video(s) Outcome: Progressing   Problem: Coping: Goal: Ability to adjust to condition or change in health will improve Outcome: Progressing   Problem: Fluid Volume: Goal: Ability to maintain a balanced intake and output will improve Outcome: Progressing   Problem: Health Behavior/Discharge Planning: Goal: Ability to identify and utilize available resources and services will improve Outcome: Progressing Goal: Ability to manage health-related needs will improve Outcome: Progressing   Problem: Metabolic: Goal: Ability to maintain appropriate glucose levels will improve Outcome: Progressing   Problem: Nutritional: Goal: Maintenance of adequate nutrition will improve Outcome: Progressing Goal: Progress toward achieving an optimal weight will improve Outcome: Progressing   Problem: Skin Integrity: Goal: Risk for impaired skin integrity will decrease Outcome: Progressing   Problem: Tissue Perfusion: Goal: Adequacy of tissue perfusion will improve Outcome: Progressing   

## 2022-12-25 LAB — RPR: RPR Ser Ql: NONREACTIVE

## 2022-12-25 LAB — GLUCOSE, CAPILLARY: Glucose-Capillary: 134 mg/dL — ABNORMAL HIGH (ref 70–99)

## 2022-12-25 MED ORDER — SIMETHICONE 80 MG PO CHEW: 80.0000 mg | CHEWABLE_TABLET | ORAL | Status: DC | PRN

## 2022-12-25 MED ORDER — FLEET ENEMA 7-19 GM/118ML RE ENEM: 1.0000 | ENEMA | Freq: Every day | RECTAL | Status: DC | PRN

## 2022-12-25 MED ORDER — METHYLERGONOVINE MALEATE 0.2 MG PO TABS: 0.2000 mg | ORAL_TABLET | ORAL | Status: DC | PRN

## 2022-12-25 MED ORDER — MEDROXYPROGESTERONE ACETATE 150 MG/ML IM SUSP: 150.0000 mg | INTRAMUSCULAR | Status: DC | PRN

## 2022-12-25 MED ORDER — MEASLES, MUMPS & RUBELLA VAC IJ SOLR: 0.5000 mL | Freq: Once | INTRAMUSCULAR | Status: DC

## 2022-12-25 MED ORDER — ACETAMINOPHEN 325 MG PO TABS
650.0000 mg | ORAL_TABLET | ORAL | Status: DC | PRN
  Administered 2022-12-25: 650 mg via ORAL
  Filled 2022-12-25: qty 2

## 2022-12-25 MED ORDER — TETANUS-DIPHTH-ACELL PERTUSSIS 5-2.5-18.5 LF-MCG/0.5 IM SUSY: 0.5000 mL | PREFILLED_SYRINGE | Freq: Once | INTRAMUSCULAR | Status: DC

## 2022-12-25 MED ORDER — COCONUT OIL OIL: 1.0000 | TOPICAL_OIL | Status: DC | PRN

## 2022-12-25 MED ORDER — IBUPROFEN 600 MG PO TABS
600.0000 mg | ORAL_TABLET | Freq: Four times a day (QID) | ORAL | Status: DC
  Administered 2022-12-25 – 2022-12-26 (×6): 600 mg via ORAL
  Filled 2022-12-25 (×6): qty 1

## 2022-12-25 MED ORDER — PRENATAL MULTIVITAMIN CH
1.0000 | ORAL_TABLET | Freq: Every day | ORAL | Status: DC
  Administered 2022-12-25 – 2022-12-26 (×2): 1 via ORAL
  Filled 2022-12-25 (×2): qty 1

## 2022-12-25 MED ORDER — DIBUCAINE (PERIANAL) 1 % EX OINT: 1.0000 | TOPICAL_OINTMENT | CUTANEOUS | Status: DC | PRN

## 2022-12-25 MED ORDER — SENNOSIDES-DOCUSATE SODIUM 8.6-50 MG PO TABS
2.0000 | ORAL_TABLET | ORAL | Status: DC
  Administered 2022-12-25 – 2022-12-26 (×2): 2 via ORAL
  Filled 2022-12-25 (×2): qty 2

## 2022-12-25 MED ORDER — FERROUS SULFATE 325 (65 FE) MG PO TABS
325.0000 mg | ORAL_TABLET | ORAL | Status: DC
  Administered 2022-12-25: 325 mg via ORAL
  Filled 2022-12-25: qty 1

## 2022-12-25 MED ORDER — BENZOCAINE-MENTHOL 20-0.5 % EX AERO: 1.0000 | INHALATION_SPRAY | CUTANEOUS | Status: DC | PRN

## 2022-12-25 MED ORDER — ONDANSETRON HCL 4 MG/2ML IJ SOLN: 4.0000 mg | INTRAMUSCULAR | Status: DC | PRN

## 2022-12-25 MED ORDER — METHYLERGONOVINE MALEATE 0.2 MG/ML IJ SOLN: 0.2000 mg | INTRAMUSCULAR | Status: DC | PRN

## 2022-12-25 MED ORDER — DIPHENHYDRAMINE HCL 25 MG PO CAPS: 25.0000 mg | ORAL_CAPSULE | Freq: Four times a day (QID) | ORAL | Status: DC | PRN

## 2022-12-25 MED ORDER — BISACODYL 10 MG RE SUPP: 10.0000 mg | Freq: Every day | RECTAL | Status: DC | PRN

## 2022-12-25 MED ORDER — WITCH HAZEL-GLYCERIN EX PADS: 1.0000 | MEDICATED_PAD | CUTANEOUS | Status: DC | PRN

## 2022-12-25 MED ORDER — ONDANSETRON HCL 4 MG PO TABS: 4.0000 mg | ORAL_TABLET | ORAL | Status: DC | PRN

## 2022-12-25 NOTE — Lactation Note (Signed)
This note was copied from a baby's chart. Lactation Consultation Note  Patient Name: Alexis Caldwell [id number] Date: 12/25/2022 Age:37 hours Reason for consult: Follow-up assessment;Early term 37-38.6wks;Breastfeeding assistance;Maternal endocrine disorder  LC entered the room and the birth parent was holding the infant.  The birth parent stated that she was concerned that the infant was not getting enough at the breast. LC reviewed milk production and supply and demand.  LC encouraged the birth parent to put the infant to the breast prior to supplementing.  LC also educated the birth parent on infant stomach size.  The birth parent had no further questions or concerns.  The birth parent will continue to work on breastfeeding and call for assistance with the latch.   Feeding Mother's Current Feeding Choice: Breast Milk and Formula Nipple Type: Slow - flow  Interventions Interventions: Education  Discharge    Consult Status Consult Status: Follow-up Date: 12/26/22 Follow-up type: In-patient   Delene Loll 12/25/2022, 4:56 PM

## 2022-12-25 NOTE — Lactation Note (Signed)
This note was copied from a baby's chart. Lactation Consultation Note  Patient Name: Alexis Caldwell [id number] Date: 12/25/2022 Age:37 hours Reason for consult: Initial assessment;Early term 37-38.6wks;Maternal endocrine disorder Baby has no interest in BF at this time. LC got baby to suckle a few times but needed much stimulation. Being that mom is GDM baby needed to feed. Mom cramping from BF, crying at intervals. Hand expressed some colostrum and w/gloved finger rubbed in baby's mouth. Attempted to get baby to suckle on gloved finger but mainly chewed on it and slept. Mom held baby STS on her chest but is very sleepy. FOB is sleeping on couch. Asked mom if LC can place baby in bassinet, mom stated please do. Baby sleeping.  Discussed w/mom the need to supplement asked mom if could set up DEBP, mom stated OK. Mom shown how to use DEBP & how to disassemble, clean, & reassemble parts. Mom doesn't want to pump right now d/t abd. Pain. Mom encouraged to feed baby 8-12 times/24 hours and with feeding cues.  Reported to nurse of baby not feeding and what LC done in rm. Encouraged mom to call out for assistance as needed.  Maternal Data Has patient been taught Hand Expression?: Yes Does the patient have breastfeeding experience prior to this delivery?: Yes How long did the patient breastfeed?: 4 months to her 37 yr old and 37 yr old  Feeding    LATCH Score Latch: Too sleepy or reluctant, no latch achieved, no sucking elicited.  Audible Swallowing: None  Type of Nipple: Flat  Comfort (Breast/Nipple): Soft / non-tender  Hold (Positioning): Assistance needed to correctly position infant at breast and maintain latch.  LATCH Score: 4   Lactation Tools Discussed/Used Tools: Pump Breast pump type: Double-Electric Breast Pump Pump Education: Setup, frequency, and cleaning Reason for Pumping: supplementation d/t GDM Pumping frequency: q3hr  Interventions Interventions: Breast feeding  basics reviewed;Assisted with latch;Skin to skin;Breast massage;Hand express;Breast compression;Adjust position;Support pillows;Position options;Expressed milk;DEBP;LC Services brochure  Discharge    Consult Status Consult Status: Follow-up Date: 12/25/22 Follow-up type: In-patient    Charyl Dancer 12/25/2022, 4:32 AM

## 2022-12-25 NOTE — Social Work (Signed)
MOB was referred for history of depression.  * Referral screened out by Clinical Social Worker because none of the following criteria appear to apply:  ~ History of anxiety/depression during this pregnancy, or of post-partum depression following prior delivery.  ~ Diagnosis of anxiety and/or depression within last 3 years. Per chart review MOB diagnosis dates back to 2015, No concerns noted during pregnancy. OR * MOB's symptoms currently being treated with medication and/or therapy.  Please contact the Clinical Social Worker if needs arise, or by MOB request.   Kimball Manske, LCSWA Clinical Social Worker [phone] 

## 2022-12-25 NOTE — Progress Notes (Signed)
Post Partum Day 1 Subjective: no complaints, up ad lib, voiding and tolerating PO, small lochia, plans to breastfeed, oral progesterone-only contraceptive .  Inner thighs are sore, feels like she "pulled a muscle". Declines stronger pain meds, would like a heating pad Objective: Blood pressure (!) 107/53, pulse 92, temperature 98.4 F (36.9 C), temperature source Oral, resp. rate 16, height 5\' 2"  (1.575 m), weight 107.1 kg, last menstrual period 04/06/2022, SpO2 100 %, unknown if currently breastfeeding.  Physical Exam:  General: alert, cooperative and no distress Lochia:normal flow Chest: CTAB Heart: RRR no m/r/g Abdomen: +BS, soft, nontender,  Uterine Fundus: firm DVT Evaluation: No evidence of DVT seen on physical exam. Extremities: trace edema  Recent Labs    12/24/22 1436  HGB 12.4  HCT 33.9*    Assessment/Plan: Plan for discharge tomorrow, Breastfeeding, and Lactation consult   LOS: 1 day   Jacklyn Shell 12/25/2022, 7:19 AM

## 2022-12-26 LAB — BIRTH TISSUE RECOVERY COLLECTION (PLACENTA DONATION)

## 2022-12-26 LAB — STREP GP B CULTURE+RFLX: Strep Gp B Culture+Rflx: NEGATIVE

## 2022-12-26 MED ORDER — NORETHINDRONE 0.35 MG PO TABS: 1.0000 | ORAL_TABLET | Freq: Every day | ORAL | 11 refills | Status: DC

## 2022-12-26 MED ORDER — FERROUS SULFATE 325 (65 FE) MG PO TABS: 325.0000 mg | ORAL_TABLET | ORAL | 3 refills | Status: DC

## 2022-12-26 MED ORDER — ACETAMINOPHEN 325 MG PO TABS: 650.0000 mg | ORAL_TABLET | ORAL | 0 refills | Status: DC | PRN

## 2022-12-26 MED ORDER — IBUPROFEN 600 MG PO TABS: 600.0000 mg | ORAL_TABLET | Freq: Four times a day (QID) | ORAL | 0 refills | Status: DC

## 2022-12-26 NOTE — Lactation Note (Signed)
This note was copied from a baby's chart. Lactation Consultation Note  Patient Name: Girl Rick Moshe [id number] Date: 12/26/2022 Age:37 hours Reason for consult: Follow-up assessment;Early term 37-38.6wks (LC attempted to see dyad and asleep, will F/U)   Maternal Data    Feeding Mother's Current Feeding Choice: Breast Milk and Formula     Consult Status Consult Status: Follow-up Date: 12/26/22 Follow-up type: In-patient    Matilde Sprang Jaxten Brosh 12/26/2022, 8:38 AM

## 2022-12-26 NOTE — Lactation Note (Signed)
This note was copied from a baby's chart. Lactation Consultation Note  Patient Name: Alexis Caldwell Date: 12/26/2022 Age:37 hours Reason for consult: Follow-up assessment;Early term 37-38.6wks;Infant weight loss;Difficult latch;Breastfeeding assistance Per mom attempted to latch just prior to [facility] entering the room , per mom baby sleepy.  LC reviewed and updated the doc flow sheets per mom.  LC offered to assist to latch and mom receptive.  LC noted the baby to have a small mouth and sucking on her tongue.  LC recommended feeding the baby a small appetizer from the bottle and then latching , baby opened wider, but did not open wide enough for a latch. LC recommended and showed mom how to pace feed and baby took 18 ml, better than she has been taking , LC switched from a slow flow to a very slow flow nipple, and baby tolerated flow better.  Dyad is on the D/C list , LC reviewed BF D/C teaching and recommended for mom to keep trying to latch, if the baby doesn't latch, face feed the baby 30 ml and gradually increase of EBM or formula.  Post pump both breast for 15 mins to work on establishing her milk supply, save milk for the next feeding.  Per mom has a DEBP .  LC recommended and offered to request a LC O/P appt and mom receptive.    Maternal Data Has patient been taught Hand Expression?: Yes  Feeding Mother's Current Feeding Choice: Breast Milk and Formula Nipple Type: Extra Slow Flow  LATCH Score Latch: Too sleepy or reluctant, no latch achieved, no sucking elicited.  Audible Swallowing: None  Type of Nipple: Everted at rest and after stimulation (areola compressible)  Comfort (Breast/Nipple): Soft / non-tender  Hold (Positioning): Assistance needed to correctly position infant at breast and maintain latch.  LATCH Score: 5   Lactation Tools Discussed/Used Tools: Pump Breast pump type: Double-Electric Breast Pump Pump Education: Milk  Storage  Interventions Interventions: Breast feeding basics reviewed;Assisted with latch;Skin to skin;Breast massage;Hand express;Reverse pressure;Breast compression;Adjust position;Support pillows;Position options;DEBP;Education;LC Services brochure  Discharge Discharge Education: Engorgement and breast care;Warning signs for feeding baby Pump: Personal;DEBP  Consult Status Consult Status: Complete Date: 12/26/22 Follow-up type: In-patient    Matilde Sprang Annalei Friesz 12/26/2022, 9:14 AM

## 2023-01-01 ENCOUNTER — Ambulatory Visit: Payer: Medicaid Other

## 2023-01-04 ENCOUNTER — Telehealth (HOSPITAL_COMMUNITY): Payer: Self-pay | Admitting: *Deleted

## 2023-01-04 NOTE — Telephone Encounter (Signed)
Patient voiced no questions or concerns regarding her health at this time. EPDS=2. Patient voiced no questions or concerns regarding infant at this time. RN reviewed ABCs of safe sleep. Patient verbalized understanding. Patient requested RN email information on hospital's postpartum classes and support groups. Email sent. Deforest Hoyles, RN, 01/04/23, 1640

## 2023-01-20 ENCOUNTER — Ambulatory Visit: Payer: Medicaid Other | Admitting: Obstetrics and Gynecology

## 2023-01-21 ENCOUNTER — Ambulatory Visit (INDEPENDENT_AMBULATORY_CARE_PROVIDER_SITE_OTHER): Payer: Medicaid Other | Admitting: Obstetrics and Gynecology

## 2023-01-21 ENCOUNTER — Other Ambulatory Visit: Payer: Medicaid Other

## 2023-01-21 DIAGNOSIS — O24410 Gestational diabetes mellitus in pregnancy, diet controlled: Secondary | ICD-10-CM

## 2023-01-21 DIAGNOSIS — Z3009 Encounter for other general counseling and advice on contraception: Secondary | ICD-10-CM | POA: Diagnosis not present

## 2023-01-21 NOTE — Progress Notes (Signed)
Post Partum Visit Note  Alexis Caldwell is a 37 y.o. [phone] female who presents for a postpartum visit. She is 4 weeks postpartum following a normal spontaneous vaginal delivery.  I have fully reviewed the prenatal and intrapartum course. The delivery was at 37 gestational weeks and 3 days.  Anesthesia: none. Postpartum course has been fine. Baby is doing well. Baby is feeding by bottle - enfamil neuropro . Bleeding staining only. Bowel function is abnormal: constipation . Bladder function is normal. Patient is sexually active. Contraception method is oral progesterone-only contraceptive. Postpartum depression screening: negative.     Health Maintenance Due  Topic Date Due   COVID-19 Vaccine (1) Never done   PAP SMEAR-Modifier  Never done    The following portions of the patient's history were reviewed and updated as appropriate: allergies, current medications, past family history, past medical history, past social history, past surgical history, and problem list.   Edinburgh Postnatal Depression Scale - 01/21/23 0927       Edinburgh Postnatal Depression Scale:  In the Past 7 Days   I have been able to laugh and see the funny side of things. 0    I have looked forward with enjoyment to things. 0    I have blamed myself unnecessarily when things went wrong. 1    I have been anxious or worried for no good reason. 0    I have felt scared or panicky for no good reason. 0    Things have been getting on top of me. 0    I have been so unhappy that I have had difficulty sleeping. 0    I have felt sad or miserable. 0    I have been so unhappy that I have been crying. 0    The thought of harming myself has occurred to me. 0    Edinburgh Postnatal Depression Scale Total 1              Review of Systems Pertinent items are noted in HPI.  Objective:  BP 112/75   Pulse 74   Wt 225 lb (102.1 kg)   LMP 04/06/2022   Breastfeeding No   BMI 41.15 kg/m    General:  alert,  cooperative, and no distress   Breasts:  not indicated  Lungs: Normal respiratory effort  GU exam:  not indicated       Assessment:   1. Postpartum state Doing well in PP state  2. Diet controlled gestational diabetes mellitus (GDM), antepartum 2hr GTT completed today  - Glucose tolerance, 2 hours  3. Encounter for counseling regarding contraception Currently on POP and interested in IUD. Has previously had mirena but reports it "getting stuck" and having it removed. Would be interested in [place] due to concern of hormone level in the other IUDs.    Routine postpartum exam.   Plan:   Essential components of care per ACOG recommendations:  1.  Mood and well being: Patient with negative depression screening today. Reviewed local resources for support.  - Patient tobacco use? Yes. Patient desires to quit? Yes.Discussed reduction and cessation  - hx of drug use? No.    2. Infant care and feeding:  -Patient currently breastmilk feeding? No.  -Social determinants of health (SDOH) reviewed in EPIC. No concerns  3. Sexuality, contraception and birth spacing - Patient does not want a pregnancy in the next year.  Desired family size is 3 children.  - Reviewed reproductive life planning. Reviewed contraceptive methods based  on pt preferences and effectiveness.  Patient desired IUD or IUS and Oral Contraceptive today.   Will return for skyla   - Discussed birth spacing of 18 months  4. Sleep and fatigue -Encouraged family/partner/community support of 4 hrs of uninterrupted sleep to help with mood and fatigue  5. Physical Recovery  - Discussed patients delivery and complications. She describes her labor as mixed./good - better than previous and felt support - Patient had a Vaginal, no problems at delivery. Patient had a  no  laceration. Perineal healing reviewed. Patient expressed understanding - Patient has urinary incontinence? No. - Patient is not safe to resume physical and sexual  activity  6.  Health Maintenance - HM due items addressed No - will do pap with IUD insertion - Last pap smear No results found for: "DIAGPAP" Pap smear not done at today's visit.  -Breast Cancer screening indicated? No.    7. Chronic Disease/Pregnancy Condition follow up: Gestational Diabetes  - PCP follow up  B'Aisha Maricela Bo, CMA Center for [employer], [facility]    Lorriane Shire, MD, FACOG Minimally Invasive Gynecologic Surgery  Obstetrics and Gynecology, [facility] for [facility], [facility] 01/21/2023

## 2023-01-21 NOTE — Patient Instructions (Signed)
Skyla IUD - good for 3 years

## 2023-01-22 LAB — GLUCOSE TOLERANCE, 2 HOURS
Glucose, 2 hour: 118 mg/dL (ref 70–139)
Glucose, GTT - Fasting: 101 mg/dL — ABNORMAL HIGH (ref 70–99)

## 2023-02-11 ENCOUNTER — Encounter: Payer: Self-pay | Admitting: Obstetrics and Gynecology

## 2023-02-11 ENCOUNTER — Ambulatory Visit (INDEPENDENT_AMBULATORY_CARE_PROVIDER_SITE_OTHER): Payer: Medicaid Other | Admitting: Obstetrics and Gynecology

## 2023-02-11 ENCOUNTER — Other Ambulatory Visit: Payer: Self-pay

## 2023-02-11 VITALS — BP 126/77 | HR 86 | Wt 221.3 lb

## 2023-02-11 DIAGNOSIS — Z3201 Encounter for pregnancy test, result positive: Secondary | ICD-10-CM | POA: Diagnosis not present

## 2023-02-11 LAB — POCT PREGNANCY, URINE: Preg Test, Ur: POSITIVE — AB

## 2023-02-11 NOTE — Progress Notes (Signed)
    GYNECOLOGY VISIT  Patient name: Yudith Norlander MRN [id number]  Date of birth: 08-21-1985 Chief Complaint:   Contraception and Gynecologic Exam  History:  Mozel Burdett is a 37 y.o. [phone] being seen today for pap smear and IUD insertion .   Has had heavy bleeding and now the bleeding is getting lighter. Had been in an argument and then had a lot of bleeding and thought it may have been a miscarriage  Hx of mAB and then had a D&C with no bleeding   Past Medical History:  Diagnosis Date   Anxiety    Depression    Gestational diabetes     Past Surgical History:  Procedure Laterality Date   NO PAST SURGERIES      The following portions of the patient's history were reviewed and updated as appropriate: allergies, current medications, past family history, past medical history, past social history, past surgical history and problem list.   Health Maintenance:   Last pap No results found for: "DIAGPAP", "HPVHIGH", "ADEQPAP" Last mammogram: n/a   Review of Systems:  Pertinent items are noted in HPI. Comprehensive review of systems was otherwise negative.   Objective:  Physical Exam BP 126/77   Pulse 86   Wt 221 lb 4.8 oz (100.4 kg)   LMP 04/06/2022   Breastfeeding No   BMI 40.48 kg/m    Physical Exam Vitals and nursing note reviewed.  Constitutional:      Appearance: Normal appearance.  HENT:     Head: Normocephalic and atraumatic.  Pulmonary:     Effort: Pulmonary effort is normal.  Skin:    General: Skin is warm and dry.  Neurological:     General: No focal deficit present.     Mental Status: She is alert.  Psychiatric:        Mood and Affect: Mood normal.        Behavior: Behavior normal.        Thought Content: Thought content normal.        Judgment: Judgment normal.        Assessment & Plan:   1. Positive urine pregnancy test Presented for IUD insertion but noted to have positive UPT. Suspect possible SAB - will get bHCG today and likely  repeat in 48 hours to assess trend. Would like to do pap when IUD done.  - Pregnancy, urine POC - Beta hCG quant (ref lab)   Lorriane Shire, MD Minimally Invasive Gynecologic Surgery Center for [facility], [facility]

## 2023-02-12 ENCOUNTER — Other Ambulatory Visit: Payer: Self-pay | Admitting: Obstetrics and Gynecology

## 2023-02-12 DIAGNOSIS — Z3201 Encounter for pregnancy test, result positive: Secondary | ICD-10-CM

## 2023-02-12 LAB — BETA HCG QUANT (REF LAB): hCG Quant: 9 m[IU]/mL

## 2023-02-15 ENCOUNTER — Telehealth: Payer: Self-pay | Admitting: Obstetrics and Gynecology

## 2023-02-15 NOTE — Telephone Encounter (Signed)
Attempted to reach patient about scheduling her appointment. Left a detailed message on her voicemail.

## 2023-03-24 ENCOUNTER — Other Ambulatory Visit (HOSPITAL_COMMUNITY)
Admission: RE | Admit: 2023-03-24 | Payer: Medicaid Other | Source: Ambulatory Visit | Admitting: Obstetrics and Gynecology

## 2023-03-24 ENCOUNTER — Encounter: Payer: Self-pay | Admitting: Obstetrics and Gynecology

## 2023-03-24 ENCOUNTER — Other Ambulatory Visit: Payer: Self-pay

## 2023-03-24 ENCOUNTER — Ambulatory Visit (INDEPENDENT_AMBULATORY_CARE_PROVIDER_SITE_OTHER): Payer: Medicaid Other | Admitting: Obstetrics and Gynecology

## 2023-03-24 VITALS — BP 142/77 | HR 87 | Wt 232.8 lb

## 2023-03-24 DIAGNOSIS — Z01419 Encounter for gynecological examination (general) (routine) without abnormal findings: Secondary | ICD-10-CM

## 2023-03-24 DIAGNOSIS — Z30430 Encounter for insertion of intrauterine contraceptive device: Secondary | ICD-10-CM | POA: Diagnosis not present

## 2023-03-24 DIAGNOSIS — Z975 Presence of (intrauterine) contraceptive device: Secondary | ICD-10-CM

## 2023-03-24 DIAGNOSIS — Z124 Encounter for screening for malignant neoplasm of cervix: Secondary | ICD-10-CM

## 2023-03-24 LAB — POCT URINALYSIS DIP (DEVICE)
Bilirubin Urine: NEGATIVE
Glucose, UA: NEGATIVE mg/dL
Hgb urine dipstick: NEGATIVE
Ketones, ur: NEGATIVE mg/dL
Leukocytes,Ua: NEGATIVE
Nitrite: NEGATIVE
Protein, ur: NEGATIVE mg/dL
Specific Gravity, Urine: 1.025 (ref 1.005–1.030)
Urobilinogen, UA: 0.2 mg/dL (ref 0.0–1.0)
pH: 7 (ref 5.0–8.0)

## 2023-03-24 LAB — POCT PREGNANCY, URINE: Preg Test, Ur: NEGATIVE

## 2023-03-24 MED ORDER — LEVONORGESTREL 19.5 MG IU IUD
INTRAUTERINE_SYSTEM | Freq: Once | INTRAUTERINE | Status: AC
Start: 2023-03-24 — End: 2023-03-24

## 2023-03-24 NOTE — Progress Notes (Signed)
    GYNECOLOGY VISIT  Patient name: Alexis Caldwell MRN [id number]  Date of birth: 07-Feb-1986 Chief Complaint:   Contraception   History:  Shantavious Alger is a 37 y.o. [phone] being seen today for IUD insertion.    The following portions of the patient's history were reviewed and updated as appropriate: allergies, current medications, past family history, past medical history, past social history, past surgical history and problem list.   Health Maintenance:   Last pap none on file Last mammogram:n/a   Review of Systems:  Pertinent items are noted in HPI. Comprehensive review of systems was otherwise negative.   Objective:  Physical Exam BP (!) 142/77   Pulse 87   Wt 232 lb 12.8 oz (105.6 kg)   LMP  (LMP Unknown)   BMI 42.58 kg/m    Physical Exam Vitals and nursing note reviewed. Exam conducted with a chaperone present.  Constitutional:      Appearance: Normal appearance.  HENT:     Head: Normocephalic and atraumatic.  Pulmonary:     Effort: Pulmonary effort is normal.     Breath sounds: Normal breath sounds.  Genitourinary:    General: Normal vulva.     Exam position: Lithotomy position.     Vagina: Normal.     Cervix: Normal.  Skin:    General: Skin is warm and dry.  Neurological:     General: No focal deficit present.     Mental Status: She is alert.  Psychiatric:        Mood and Affect: Mood normal.        Behavior: Behavior normal.        Thought Content: Thought content normal.        Judgment: Judgment normal.      IUD Insertion Procedure Note Patient identified, informed consent performed, consent signed.   Discussed risks of irregular bleeding, cramping, infection, malpositioning or misplacement of the IUD outside the uterus which may require further procedure such as laparoscopy. Also discussed >99% contraception efficacy, increased risk of ectopic pregnancy with failure of method.  Time out was performed.  Urine pregnancy test  negative.  Speculum placed in the vagina.  Cervix visualized.  Cleaned with Betadine x 2.  Anterior cervix grasped with a single tooth tenaculum.  Paracervical block was not administered.  Kyleena IUD placed per manufacturer's recommendations.  Strings trimmed to 3 cm. Tenaculum was removed, good hemostasis noted.  Patient tolerated procedure well.   Patient was given post-procedure instructions.  She was advised to have backup contraception for one week.  Patient was also asked to check IUD strings periodically and follow up prn for IUD check.      Assessment & Plan:   1. Encounter for Pap cervical smear following prior abnormal smear 2. Encounter for cervical Pap smear with pelvic exam Routine pap collected - Cytology - PAP  3. Encounter for IUD insertion Uncomplicated kyleena insertion. Reviewed good for 5 years and continue OCP for 1 additional week.   Routine preventative health maintenance measures emphasized.  Lorriane Shire, MD Minimally Invasive Gynecologic Surgery Center for [facility], [facility]

## 2023-03-26 DIAGNOSIS — Z975 Presence of (intrauterine) contraceptive device: Secondary | ICD-10-CM | POA: Insufficient documentation

## 2023-07-06 ENCOUNTER — Other Ambulatory Visit: Payer: Self-pay

## 2024-04-06 ENCOUNTER — Other Ambulatory Visit: Payer: Self-pay

## 2024-04-06 ENCOUNTER — Encounter: Payer: Self-pay | Admitting: Physical Therapy

## 2024-04-06 ENCOUNTER — Ambulatory Visit: Attending: Orthopedic Surgery | Admitting: Physical Therapy

## 2024-04-06 DIAGNOSIS — M5417 Radiculopathy, lumbosacral region: Secondary | ICD-10-CM | POA: Diagnosis present

## 2024-04-06 NOTE — Therapy (Signed)
 OUTPATIENT PHYSICAL THERAPY THORACOLUMBAR EVALUATION   Patient Name: Alexis Caldwell MRN: [id number] DOB:Aug 14, 1986, 38 y.o., female Today's Date: 04/06/2024  END OF SESSION:  PT End of Session - 04/06/24 1709     Visit Number 1    Number of Visits 1          Past Medical History:  Diagnosis Date   Anxiety    Depression    Gestational diabetes    Past Surgical History:  Procedure Laterality Date   NO PAST SURGERIES     Patient Active Problem List   Diagnosis Date Noted   IUD (intrauterine device) in place 03/26/2023   Polyhydramnios affecting pregnancy in third trimester 12/22/2022   Gestational diabetes mellitus (GDM), antepartum 10/21/2022   Lumbar radiculopathy 04/23/2022   History of gestational diabetes 06/20/2014   Depression, unspecified 10/11/2013    PCP: Rosalea Rosina SAILOR, PA  REFERRING PROVIDER: Beuford Anes, MD  REFERRING DIAG: Lumbar radiculopathy  Rationale for Evaluation and Treatment: Rehabilitation  THERAPY DIAG:  Radiculopathy, lumbosacral region  PERTINENT HISTORY: See pmh  WEIGHT BEARING RESTRICTIONS: No  FALLS:  Has patient fallen in last 6 months? Yes. Number of falls July 24th, 4 times in one day during travel might have fallen last week, cannot remember.  LIVING ENVIRONMENT: Lives with: lives with their family Lives in: House/apartment Stairs:  Has following equipment at home: Single point cane   PRECAUTIONS: None ---------------------------------------------------------------------------------------------  SUBJECTIVE:                                                                                                                                                                                           SUBJECTIVE STATEMENT: Eval statement 04/06/2024: Pt stated she's been dealing with BLE radicular symptoms R>L for over a year. Pt has fallen multiple times over the past few months and had an initial trial of PT last year. Pt  has not had an MRI and symptoms have been getting worse even with previous PT. Today is reporting modrate-severe pain with bowl and bladder dysfunction, Pt directly refused PT offer to call EMS and stated she will drive herself against advice and education to the ED.  RED FLAGS: Bowel or bladder incontinence: Yes: reports inability to feel when she has to go, commonly wets herself.    PLOF: Independent  PATIENT GOALS: just wants an MRI.  NEXT MD VISIT: needs to schedule ---------------------------------------------------------------------------------------------  OBJECTIVE:  Note: Objective measures were completed at Evaluation unless otherwise noted.  DIAGNOSTIC FINDINGS:  No recent imaging in pt chart.  PATIENT SURVEYS:  LEFS: 8/80  COGNITION: Overall cognitive status: Within functional limits for tasks assessed   PALPATION:  Eval was terminated prior to this step.  SENSATION: Light touch: Impaired  Proprioception: Impaired    POSTURE: flexed trunk    LUMBAR ROM:   AROM eval  Flexion   Extension   Right lateral flexion   Left lateral flexion   Right rotation   Left rotation    (Blank rows = not tested)  ! Indicates pain with testing  LOWER EXTREMITY ROM:   Eval was terminated prior to this step.  Passive  Right eval Left eval  Hip flexion    Hip extension    Hip abduction    Hip adduction    Hip internal rotation    Hip external rotation    Knee flexion    Knee extension    Ankle dorsiflexion    Ankle plantarflexion    Ankle inversion    Ankle eversion     (Blank rows = not tested)  ! Indicates pain with testing  LOWER EXTREMITY MMT:    MMT Right eval Left eval  Hip flexion    Hip extension    Hip abduction    Hip adduction    Hip internal rotation    Hip external rotation    Knee flexion    Knee extension    Ankle dorsiflexion    Ankle plantarflexion    Ankle inversion    Ankle eversion     (Blank rows = not tested)   ! Indicates  pain with testing LUMBAR SPECIAL TESTS:  Eval was terminated prior to this step.  FUNCTIONAL TESTS:  Eval was terminated prior to this step.  GAIT: Distance walked: 117ft Assistive device utilized: Walker - 2 wheeled Level of assistance: CGA Comments: gait belt, reduced RLE push off, stance control, LLE step length.  [facility] Adult PT Treatment:                                                DATE: 04/06/2024  Self Care: Pt education regarding symptom presentation, potential need for emergent intervention.     Use of FWW and DME fitting.                                                                                                                            PATIENT EDUCATION:  Person educated: Patient Education method: Explanation Education comprehension: verbalized understanding  HOME EXERCISE PROGRAM: Eval was terminated prior to this step. ---------------------------------------------------------------------------------------------  ASSESSMENT:  CLINICAL IMPRESSION: Eval impression (04/06/2024): Pt. attended today's physical therapy session for evaluation of lumbar radiculopathy. Pt has complaints of long term onset and worsening symptoms with B radicular symptoms and mobility deficits. Pt has notable deficits with strength, gait, and sensation in BLE. During Eval pt gave reports of recently not being able to feel when she has to urinate, commonly is incontinent and has severe pain in the low  back/groin. At this point the eval was terminated with an explanation of emergent signs concurrent with cauda equina syndrome. An offer to call EMS was extended, pt refused and stated she will drive herself to the ED. Education was given on safety concerns and pt continued to refuse, stating she will drive herself.  Pt is not currently appropriate for OPPT and was referred/educated to report to the ED with these c/c.    GOALS: not appropriate at current time for  OPPT ---------------------------------------------------------------------------------------------  PLAN:  PT FREQUENCY: one time visit  PT DURATION: other: this visit  PLANNED INTERVENTIONS: 97110-Therapeutic exercises, 97530- Therapeutic activity, [id number]- Neuromuscular re-education, 97535- Self Care, [id number]- Manual therapy, [phone]- Gait training, and Patient/Family education.  PLAN FOR NEXT SESSION: no plan, pt should attend ED.   Mabel Kiang, PT, DPT 04/06/2024, 5:10 PM

## 2024-04-09 ENCOUNTER — Emergency Department (HOSPITAL_COMMUNITY)

## 2024-04-09 ENCOUNTER — Encounter (HOSPITAL_COMMUNITY): Payer: Self-pay

## 2024-04-09 ENCOUNTER — Emergency Department (HOSPITAL_COMMUNITY)
Admission: EM | Admit: 2024-04-09 | Discharge: 2024-04-09 | Disposition: A | Discharge status: Home / Self Care | Attending: Emergency Medicine | Admitting: Emergency Medicine

## 2024-04-09 ENCOUNTER — Other Ambulatory Visit: Payer: Self-pay

## 2024-04-09 DIAGNOSIS — M545 Low back pain, unspecified: Secondary | ICD-10-CM | POA: Diagnosis present

## 2024-04-09 DIAGNOSIS — N3001 Acute cystitis with hematuria: Secondary | ICD-10-CM | POA: Insufficient documentation

## 2024-04-09 DIAGNOSIS — M5441 Lumbago with sciatica, right side: Secondary | ICD-10-CM | POA: Diagnosis not present

## 2024-04-09 DIAGNOSIS — M79661 Pain in right lower leg: Secondary | ICD-10-CM | POA: Diagnosis not present

## 2024-04-09 DIAGNOSIS — G8929 Other chronic pain: Secondary | ICD-10-CM | POA: Diagnosis not present

## 2024-04-09 DIAGNOSIS — M7989 Other specified soft tissue disorders: Secondary | ICD-10-CM | POA: Diagnosis not present

## 2024-04-09 DIAGNOSIS — M5442 Lumbago with sciatica, left side: Secondary | ICD-10-CM | POA: Insufficient documentation

## 2024-04-09 DIAGNOSIS — D72829 Elevated white blood cell count, unspecified: Secondary | ICD-10-CM | POA: Diagnosis not present

## 2024-04-09 LAB — BASIC METABOLIC PANEL WITH GFR
Anion gap: 10 (ref 5–15)
BUN: 10 mg/dL (ref 6–20)
CO2: 21 mmol/L — ABNORMAL LOW (ref 22–32)
Calcium: 9.1 mg/dL (ref 8.9–10.3)
Chloride: 105 mmol/L (ref 98–111)
Creatinine, Ser: 0.85 mg/dL (ref 0.44–1.00)
GFR, Estimated: >60 mL/min (ref >60)
Glucose, Bld: 101 mg/dL — ABNORMAL HIGH (ref 70–99)
Potassium: 3.9 mmol/L (ref 3.5–5.1)
Sodium: 136 mmol/L (ref 135–145)

## 2024-04-09 LAB — CBC WITH DIFFERENTIAL/PLATELET
Abs Immature Granulocytes: 0.02 K/uL (ref 0.00–0.07)
Basophils Absolute: 0.1 K/uL (ref 0.0–0.1)
Basophils Relative: 1 %
Eosinophils Absolute: 0.2 K/uL (ref 0.0–0.5)
Eosinophils Relative: 2 %
HCT: 40.2 % (ref 36.0–46.0)
Hemoglobin: 13.6 g/dL (ref 12.0–15.0)
Immature Granulocytes: 0 %
Lymphocytes Relative: 39 %
Lymphs Abs: 4.3 K/uL — ABNORMAL HIGH (ref 0.7–4.0)
MCH: 31.2 pg (ref 26.0–34.0)
MCHC: 33.8 g/dL (ref 30.0–36.0)
MCV: 92.2 fL (ref 80.0–100.0)
Monocytes Absolute: 1.1 K/uL — ABNORMAL HIGH (ref 0.1–1.0)
Monocytes Relative: 10 %
Neutro Abs: 5.4 K/uL (ref 1.7–7.7)
Neutrophils Relative %: 48 %
Platelets: 280 K/uL (ref 150–400)
RBC: 4.36 MIL/uL (ref 3.87–5.11)
RDW: 12.3 % (ref 11.5–15.5)
WBC: 11 K/uL — ABNORMAL HIGH (ref 4.0–10.5)
nRBC: 0 % (ref 0.0–0.2)

## 2024-04-09 LAB — URINALYSIS, W/ REFLEX TO CULTURE (INFECTION SUSPECTED)
Bilirubin Urine: NEGATIVE
Glucose, UA: NEGATIVE mg/dL
Ketones, ur: NEGATIVE mg/dL
Leukocytes,Ua: NEGATIVE
Nitrite: POSITIVE — AB
Protein, ur: NEGATIVE mg/dL
Specific Gravity, Urine: 1.02 (ref 1.005–1.030)
pH: 6 (ref 5.0–8.0)

## 2024-04-09 LAB — HCG, SERUM, QUALITATIVE: Preg, Serum: NEGATIVE

## 2024-04-09 LAB — BRAIN NATRIURETIC PEPTIDE: B Natriuretic Peptide: 9.8 pg/mL (ref 0.0–100.0)

## 2024-04-09 MED ORDER — KETOROLAC TROMETHAMINE 15 MG/ML IJ SOLN
15.0000 mg | Freq: Once | INTRAMUSCULAR | Status: AC
  Administered 2024-04-09: 15 mg via INTRAVENOUS
  Filled 2024-04-09: qty 1

## 2024-04-09 MED ORDER — PREDNISONE 50 MG PO TABS
50.0000 mg | ORAL_TABLET | Freq: Every day | ORAL | 0 refills | Status: AC
Start: 2024-04-09 — End: 2024-04-13

## 2024-04-09 MED ORDER — SULFAMETHOXAZOLE-TRIMETHOPRIM 800-160 MG PO TABS
1.0000 | ORAL_TABLET | Freq: Once | ORAL | Status: AC
  Administered 2024-04-09: 1 via ORAL
  Filled 2024-04-09: qty 1

## 2024-04-09 MED ORDER — MORPHINE SULFATE (PF) 4 MG/ML IV SOLN
4.0000 mg | Freq: Once | INTRAVENOUS | Status: AC
  Administered 2024-04-09: 4 mg via INTRAVENOUS
  Filled 2024-04-09: qty 1

## 2024-04-09 MED ORDER — LIDOCAINE 5 % EX PTCH: 1.0000 | MEDICATED_PATCH | CUTANEOUS | 0 refills | Status: DC

## 2024-04-09 MED ORDER — OXYCODONE-ACETAMINOPHEN 5-325 MG PO TABS
1.0000 | ORAL_TABLET | ORAL | Status: DC | PRN
  Administered 2024-04-09: 1 via ORAL
  Filled 2024-04-09: qty 1

## 2024-04-09 MED ORDER — ONDANSETRON HCL 4 MG/2ML IJ SOLN
4.0000 mg | Freq: Once | INTRAMUSCULAR | Status: AC
  Administered 2024-04-09: 4 mg via INTRAVENOUS
  Filled 2024-04-09: qty 2

## 2024-04-09 MED ORDER — LIDOCAINE 5 % EX PTCH
2.0000 | MEDICATED_PATCH | CUTANEOUS | Status: DC
  Administered 2024-04-09: 2 via TRANSDERMAL
  Filled 2024-04-09: qty 2

## 2024-04-09 MED ORDER — SULFAMETHOXAZOLE-TRIMETHOPRIM 800-160 MG PO TABS: 1.0000 | ORAL_TABLET | Freq: Two times a day (BID) | ORAL | 0 refills | Status: AC

## 2024-04-09 MED ORDER — OXYCODONE-ACETAMINOPHEN 5-325 MG PO TABS: 1.0000 | ORAL_TABLET | Freq: Four times a day (QID) | ORAL | 0 refills | Status: DC | PRN

## 2024-04-09 NOTE — ED Notes (Signed)
 Called lab they will run UA. Urine already sent to lab

## 2024-04-09 NOTE — ED Notes (Signed)
 Pt to MRI

## 2024-04-09 NOTE — ED Provider Notes (Signed)
 [facility] Provider Note   CSN: [id number] Arrival date & time: 04/09/24  1433    Patient presents with: Hip Pain and Chest Pain   Alexis Caldwell is a 38 y.o. female here for evaluation of lower back pain.  Patient states she has had lower back pain for almost approximately 2 years.  She initially was seen by [facility] and was getting PT services at that time.  Subsequently became pregnant and was discharged from their service.  When she gave birth in 2024 she began experiencing worse back pain.  Goes around her bilateral lower back right greater than left down through her legs.  She states she has multiple falls because her legs turn to Jell-O.  She occasionally will walk with a cane.  She states she has chronic urinary incontinence that has been present for over a year.  No bowel incontinence however feels like she does not have the strength to have a bowel movement.  This also began greater than 1 year ago.  She states she has intermittent bilateral lower extremity swelling.  States she has been seen by her PCP for this multiple times.  Was seen by PT 04/06/2024 due to incontinence was referred to the emergency department for MRI to rule out cauda equina however declined at that time.  She did recently have some travel about a month ago.  Does admit that she struggles with anxiety.  No chest pain, shortness of breath, history of PE or DVT.  No fever, history of IVDU, malignancy.  Prescribed Mobic and gabapentin without relief.   HPI     Prior to Admission medications   Medication Sig Start Date End Date Taking? Authorizing Provider  lidocaine  (LIDODERM ) 5 % Place 1 patch onto the skin daily. Remove & Discard patch within 12 hours or as directed by MD 04/09/24  Yes Sy Saintjean A, PA-C  oxyCODONE -acetaminophen  (PERCOCET/ROXICET) 5-325 MG tablet Take 1 tablet by mouth every 6 (six) hours as needed for severe pain (pain score 7-10). 04/09/24  Yes  Sophie Quiles A, PA-C  predniSONE  (DELTASONE ) 50 MG tablet Take 1 tablet (50 mg total) by mouth daily for 4 days. 04/09/24 04/13/24 Yes Anniemae Haberkorn A, PA-C  sulfamethoxazole -trimethoprim  (BACTRIM  DS) 800-160 MG tablet Take 1 tablet by mouth 2 (two) times daily for 7 days. 04/09/24 04/16/24 Yes Isaia Hassell A, PA-C  norethindrone  (ORTHO MICRONOR ) 0.35 MG tablet Take 1 tablet (0.35 mg total) by mouth daily. 12/26/22   Cresenzo, John V, MD    Allergies: Penicillins    Review of Systems  Constitutional: Negative.   HENT: Negative.    Respiratory: Negative.    Cardiovascular: Negative.   Gastrointestinal: Negative.   Genitourinary:  Positive for frequency and urgency. Negative for vaginal discharge and vaginal pain.  Musculoskeletal:  Positive for back pain.  Neurological:  Positive for weakness.  All other systems reviewed and are negative.   Updated Vital Signs BP 121/70   Pulse 67   Temp 98 F (36.7 C) (Oral)   Resp 18   SpO2 100%   Physical Exam Vitals and nursing note reviewed.  Constitutional:      General: She is not in acute distress.    Appearance: She is well-developed. She is not ill-appearing, toxic-appearing or diaphoretic.  HENT:     Head: Normocephalic and atraumatic.  Eyes:     Pupils: Pupils are equal, round, and reactive to light.  Cardiovascular:     Rate and Rhythm: Normal  rate.     Pulses:          Radial pulses are 2+ on the right side and 2+ on the left side.       Dorsalis pedis pulses are 2+ on the right side and 2+ on the left side.     Heart sounds: Normal heart sounds.  Pulmonary:     Effort: Pulmonary effort is normal. No respiratory distress.     Breath sounds: Normal breath sounds.  Chest:     Chest wall: No mass or tenderness.  Abdominal:     General: Bowel sounds are normal. There is no distension.     Palpations: Abdomen is soft.  Musculoskeletal:        General: Normal range of motion.     Cervical back: Normal range of motion.      Right lower leg: No tenderness. No edema.     Left lower leg: No tenderness. No edema.     Comments: Diffuse tenderness throughout lower back.  Positive straight leg raise bilaterally at 45 degrees.  Compartments soft.  Full range of motion.  Skin:    General: Skin is warm and dry.     Capillary Refill: Capillary refill takes less than 2 seconds.     Comments: No obvious rashes, lesions, lower extremity edema.  Neurological:     General: No focal deficit present.     Mental Status: She is alert.     Comments: Cranial nerves II through XII grossly intact Bilateral upper extremities 5/5 strength Bilateral lower extremities 4/5 strength, possible effort component Intact sensation Ambulatory, no foot drop  Psychiatric:        Mood and Affect: Mood normal.     (all labs ordered are listed, but only abnormal results are displayed) Labs Reviewed  CBC WITH DIFFERENTIAL/PLATELET - Abnormal; Notable for the following components:      Result Value   WBC 11.0 (*)    Lymphs Abs 4.3 (*)    Monocytes Absolute 1.1 (*)    All other components within normal limits  BASIC METABOLIC PANEL WITH GFR - Abnormal; Notable for the following components:   CO2 21 (*)    Glucose, Bld 101 (*)    All other components within normal limits  URINALYSIS, W/ REFLEX TO CULTURE (INFECTION SUSPECTED) - Abnormal; Notable for the following components:   APPearance HAZY (*)    Hgb urine dipstick MODERATE (*)    Nitrite POSITIVE (*)    Bacteria, UA RARE (*)    All other components within normal limits  HCG, SERUM, QUALITATIVE  BRAIN NATRIURETIC PEPTIDE    EKG: EKG Interpretation Date/Time:  Sunday April 09 2024 14:44:03 EDT Ventricular Rate:  96 PR Interval:  124 QRS Duration:  82 QT Interval:  354 QTC Calculation: 447 R Axis:   80  Text Interpretation: Normal sinus rhythm Low voltage QRS Borderline ECG No previous ECGs available no prior ECG for comparison No STEMI Confirmed by Ginger Barefoot ([id number])  on 04/09/2024 6:08:24 PM  Radiology: DG Pelvis 1-2 Views Result Date: 04/09/2024 CLINICAL DATA:  hip pain EXAM: PELVIS - 1-2 VIEW COMPARISON:  None Available. FINDINGS: T-shaped intra and vice overlies the pelvis with exact positioning on radiography unclear. There is no evidence of pelvic fracture or diastasis. No acute displaced fracture or dislocation of either hips. No pelvic bone lesions are seen. IMPRESSION: 1. Negative for acute traumatic injury. 2. T-shaped intra and vice overlies the pelvis with exact positioning on radiography unclear. Electronically  Signed   By: Morgane  Naveau M.D.   On: 04/09/2024 20:04   VAS US  LOWER EXTREMITY VENOUS (DVT) (ONLY [facility] & [facility]) Result Date: 04/09/2024  Lower Venous DVT Study Patient Name:  JYRA LAGARES  Date of Exam:   04/09/2024 Medical Rec #: [id number]         Accession #:    [id number] Date of Birth: [birth year]         Patient Gender: F Patient Age:   64 years Exam Location:  [facility] Procedure:      VAS US  LOWER EXTREMITY VENOUS (DVT) Referring Phys: Teara Duerksen --------------------------------------------------------------------------------  Indications: Long-term Intractable pain of hips that radiates down legs into knees since the birth of her daughter a year ago.  Limitations: Patient unable to extend legs or remain still secondary to pain. Comparison Study: No prior study on file Performing Technologist: Alberta Lis RVS  Examination Guidelines: A complete evaluation includes B-mode imaging, spectral Doppler, color Doppler, and power Doppler as needed of all accessible portions of each vessel. Bilateral testing is considered an integral part of a complete examination. Limited examinations for reoccurring indications may be performed as noted. The reflux portion of the exam is performed with the patient in reverse Trendelenburg.  +---------+---------------+---------+-----------+----------+-------------------+ RIGHT     CompressibilityPhasicitySpontaneityPropertiesThrombus Aging      +---------+---------------+---------+-----------+----------+-------------------+ CFV      Full           Yes      Yes                                      +---------+---------------+---------+-----------+----------+-------------------+ SFJ      Full                                                             +---------+---------------+---------+-----------+----------+-------------------+ FV Prox  Full                                                             +---------+---------------+---------+-----------+----------+-------------------+ FV Mid                  Yes      Yes                  patent by color and                                                       Doppler             +---------+---------------+---------+-----------+----------+-------------------+ FV Distal                                             patent by color     +---------+---------------+---------+-----------+----------+-------------------+ PFV      Full                                                             +---------+---------------+---------+-----------+----------+-------------------+  POP      Full                                                             +---------+---------------+---------+-----------+----------+-------------------+ PTV      Full                                                             +---------+---------------+---------+-----------+----------+-------------------+ PERO     Full                                                             +---------+---------------+---------+-----------+----------+-------------------+ Gastroc  Full                                                             +---------+---------------+---------+-----------+----------+-------------------+   +---------+---------------+---------+-----------+----------+-------------------+ LEFT      CompressibilityPhasicitySpontaneityPropertiesThrombus Aging      +---------+---------------+---------+-----------+----------+-------------------+ CFV      Full           Yes      Yes                                      +---------+---------------+---------+-----------+----------+-------------------+ SFJ      Full                                                             +---------+---------------+---------+-----------+----------+-------------------+ FV Prox  Full                                                             +---------+---------------+---------+-----------+----------+-------------------+ FV Mid                  Yes      Yes                  patent by color and                                                       Doppler             +---------+---------------+---------+-----------+----------+-------------------+ FV Distal  Yes      Yes                  patent by color and                                                       Doppler             +---------+---------------+---------+-----------+----------+-------------------+ PFV      Full                                                             +---------+---------------+---------+-----------+----------+-------------------+ POP      Full           No       Yes                                      +---------+---------------+---------+-----------+----------+-------------------+ PTV      Full                                                             +---------+---------------+---------+-----------+----------+-------------------+ PERO     Full                                                             +---------+---------------+---------+-----------+----------+-------------------+     Summary: RIGHT: - There is no evidence of deep vein thrombosis in the lower extremity. However, portions of this examination were limited- see technologist comments above.   LEFT: - There is no evidence of deep vein thrombosis in the lower extremity. However, portions of this examination were limited- see technologist comments above.  *See table(s) above for measurements and observations.    Preliminary    MR LUMBAR SPINE WO CONTRAST Result Date: 04/09/2024 CLINICAL DATA:  Provided history: Low back pain, cauda equina syndrome suspected. EXAM: MRI LUMBAR SPINE WITHOUT CONTRAST TECHNIQUE: Multiplanar, multisequence MR imaging of the lumbar spine was performed. No intravenous contrast was administered. COMPARISON:  Lumbar spine radiographs 03/30/2022. FINDINGS: Segmentation: Transitional anatomy. For the purposes of this dictation, spinal numbering will remain consistent with that utilized on the prior lumbar spine radiographs of 03/30/2022. 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 Alignment: Slight T12-L1 grade 1 retrolisthesis. 2-3 mm L5-S1 grade 1 retrolisthesis. Vertebrae: No lumbar vertebral compression fracture. Trace degenerative endplate edema at U87-O8. Mild degenerative endplate edema at O4-D8. Marrow edema within the posterior elements on the right at L5, likely degenerative and related to facet arthropathy. Marrow edema within the L4 and L5 spinous process, also likely degenerative. Conus medullaris and cauda equina: Conus extends to the  L1-L2 level. No signal identified within the visualized distal spinal cord. Paraspinal and other soft tissues: No acute finding within included portions of the abdomen/retroperitoneum. No paraspinal mass or collection. Disc levels: Multilevel disc degeneration, greatest at T12-L1 and L5-S1 (moderate at these levels). Developmentally narrow lower lumbar spinal canal due to short pedicles. T12-L1: Disc bulge. No significant degenerative spinal canal or foraminal stenosis. L1-L2: Slight disc bulge. No significant degenerative  spinal canal or foraminal stenosis. L2-L3: Slight disc bulge. Superimposed shallow broad-based left subarticular disc protrusion. Mild facet hypertrophy. Mild left subarticular narrowing. No significant central canal stenosis or neural foraminal narrowing. L3-L4: Slight disc bulge. Facet hypertrophy (greater on the right). Mild ligamentum flavum hypertrophy. No significant degenerative spinal canal stenosis or neural foraminal narrowing. L4-L5: Disc bulge. Facet degeneration and hypertrophy. Ligamentum flavum hypertrophy. No significant degenerative spinal canal stenosis. Mild relative bilateral neural foraminal narrowing. L5-S1: Grade 1 retrolisthesis. Disc bulge with endplate spurring. Superimposed small left center/subarticular disc protrusion. Facet degeneration and hypertrophy. Ligamentum flavum hypertrophy. Bilateral subarticular stenosis (with potential to affect either descending S1 nerve root). Moderate bilateral neural foraminal narrowing. IMPRESSION: 1. Transitional anatomy. For the purposes of this dictation, spinal numbering will remain consistent with that utilized on the prior lumbar spine radiographs of 03/30/2022. 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 Careful attention to spinal numbering is recommended prior to any intervention. 2. Developmentally narrow lower lumbar spinal canal (due to short pedicles). 3. Lumbar spondylosis as outlined within the body of the report. 4. At L5-S1, there is moderate disc degeneration with mild degenerative endplate edema. Multifactorial bilateral subarticular stenosis (with potential to affect either descending S1 nerve root). Moderate bilateral neural foraminal narrowing. 5. At L4-L5, there is multifactorial mild relative bilateral neural foraminal narrowing. 6. At L2-L3, a shallow broad-based left subarticular disc protrusion results in mild left subarticular  stenosis. 7. Marrow edema within the posterior elements on the right at L5-S1 and within the L4 and L5 spinous processes, likely degenerative. Electronically Signed   By: Rockey Childs D.O.   On: 04/09/2024 18:57     Procedures   Medications Ordered in the ED  oxyCODONE -acetaminophen  (PERCOCET/ROXICET) 5-325 MG per tablet 1 tablet (1 tablet Oral Given 04/09/24 1447)  lidocaine  (LIDODERM ) 5 % 2 patch (2 patches Transdermal Patch Applied 04/09/24 2031)  morphine  (PF) 4 MG/ML injection 4 mg (4 mg Intravenous Given 04/09/24 1708)  ondansetron  (ZOFRAN ) injection 4 mg (4 mg Intravenous Given 04/09/24 1707)  ketorolac  (TORADOL ) 15 MG/ML injection 15 mg (15 mg Intravenous Given 04/09/24 2030)  sulfamethoxazole -trimethoprim  (BACTRIM  DS) 800-160 MG per tablet 1 tablet (1 tablet Oral Given 04/09/24 2031)    Clinical Course as of 04/09/24 2125  Sun Apr 09, 2024  1916 VAS US  LOWER EXTREMITY VENOUS (DVT) (ONLY [facility] & [facility]) Neg per US  tech [BH]  1946 Discussed with Neurosurgery PA. Patient can FU outpatient [BH]  2015 Hemoglobin: 13.6 [BH]    Clinical Course User Index [BH] Mylena Sedberry A, PA-C    38 year old here for evaluation multiple complaints.  Has had chronic lower back pain radiating throughout bilateral legs for over 1 year.  Also associated urinary incontinence.  Seen by PT who recommended come to the ED to rule out cauda equina.  She has had multiple falls since her back pain started 1 year ago.  Does admit to the urinary incontinence that started prior to the birth of her child about a year  and a half ago.  Has noted some malodorous urine however no flank pain, abdominal pain.  Will plan on labs, imaging and reassess  Labs and imaging personally viewed and interpreted:  CBC leukocytosis 11.0 Metabolic panel without significant abnormality BNP 9.8 Pregnancy test UA positive nitrite given malodorous urine will treat for UTI Ultrasound negative for DVT EKG without ischemic changes Pelvic xray  without bony abnormality does show IUD, unclear if malpositioned MRI with multiple finding  Discussed with neurosurgery, recommend outpatient follow-up  Discussed with patient here.  Will treat her UTI with Bactrim  given her lower back pain however back pain seems more lumbar in nature.  Will give pain medicine, prednisone  for her back pain and have her follow-up with neurosurgery.  He is agreeable.  Ambulatory here.  Neurovascular intact.  The patient has been appropriately medically screened and/or stabilized in the ED. I have low suspicion for any other emergent medical condition which would require further screening, evaluation or treatment in the ED or require inpatient management.  Patient is hemodynamically stable and in no acute distress.  Patient able to ambulate in department prior to ED.  Evaluation does not show acute pathology that would require ongoing or additional emergent interventions while in the emergency department or further inpatient treatment.  I have discussed the diagnosis with the patient and answered all questions.  Pain is been managed while in the emergency department and patient has no further complaints prior to discharge.  Patient is comfortable with plan discussed in room and is stable for discharge at this time.  I have discussed strict return precautions for returning to the emergency department.  Patient was encouraged to follow-up with PCP/specialist refer to at discharge.                                  Medical Decision Making Amount and/or Complexity of Data Reviewed External Data Reviewed: labs, radiology, ECG and notes. Labs: ordered. Decision-making details documented in ED Course. Radiology: ordered and independent interpretation performed. Decision-making details documented in ED Course. ECG/medicine tests: ordered and independent interpretation performed. Decision-making details documented in ED Course.  Risk OTC drugs. Prescription drug  management. Parenteral controlled substances. Decision regarding hospitalization. Diagnosis or treatment significantly limited by social determinants of health.       Final diagnoses:  Chronic bilateral low back pain with bilateral sciatica  Acute cystitis with hematuria    ED Discharge Orders          Ordered    sulfamethoxazole -trimethoprim  (BACTRIM  DS) 800-160 MG tablet  2 times daily        04/09/24 2101    oxyCODONE -acetaminophen  (PERCOCET/ROXICET) 5-325 MG tablet  Every 6 hours PRN        04/09/24 2101    lidocaine  (LIDODERM ) 5 %  Every 24 hours        04/09/24 2101    predniSONE  (DELTASONE ) 50 MG tablet  Daily        04/09/24 2101               Daryll Spisak A, PA-C 04/09/24 2125    Tegeler, Lonni PARAS, MD 04/09/24 2322

## 2024-04-09 NOTE — ED Triage Notes (Signed)
 Pt c.o bilateral hip pain that radiates down her legs, worse on the right side that's been going on for over a year now since she delivered her daughter vaginally, states she's been doing PT for it.Pt also saw to PCP and was given rx for gabapentin and meloxicam with no relief. Pt tearful in triage and states her doctor keeps focusing on her back and has never gotten imaging on her hips. Pt also c.o an episode on chest pain with right arm numbness that lasted briefly. Pt thinks it is related to her anxiety.

## 2024-04-09 NOTE — Discharge Instructions (Addendum)
 It was a pleasure taking care of you here today  We are treating you for urinary tract infection with a medication called Bactrim   I am treating your back pain with Percocet this is an opiate prescription.  It may make you tired or sleepy.  Do not drive or operate heavy machinery while taking your medications.  Call the neurosurgery office listed on discharge paperwork tomorrow  Return for new or worsening symptoms

## 2024-04-09 NOTE — Progress Notes (Signed)
 VASCULAR LAB    Bilateral lower extremity venous duplex has been performed.  See CV proc for preliminary results.  Gave verbal report to [employer], PA-C  Kreed Kauffman, RVT 04/09/2024, 7:13 PM

## 2024-04-12 ENCOUNTER — Other Ambulatory Visit: Payer: Self-pay

## 2024-04-12 ENCOUNTER — Encounter: Payer: Self-pay | Admitting: Obstetrics and Gynecology

## 2024-04-12 ENCOUNTER — Ambulatory Visit (INDEPENDENT_AMBULATORY_CARE_PROVIDER_SITE_OTHER): Admitting: Obstetrics and Gynecology

## 2024-04-12 VITALS — BP 119/82 | HR 88 | Wt 208.3 lb

## 2024-04-12 DIAGNOSIS — Z30432 Encounter for removal of intrauterine contraceptive device: Secondary | ICD-10-CM | POA: Diagnosis not present

## 2024-04-12 DIAGNOSIS — Z1331 Encounter for screening for depression: Secondary | ICD-10-CM

## 2024-04-12 NOTE — Progress Notes (Signed)
 GYNECOLOGY VISIT  Patient name: Alexis Caldwell MRN [id number]  Date of birth: June 16, 1986 Chief Complaint:   Contraception and Gynecologic Exam  History:  [name] is a 38 y.o. [phone] being seen today for IUD removal. Has been having right sided pain as well and unsure if due to pain or low back issues.  Having spotting and then a week with nothing, heavy for her regular period for 3-4 days and then spotting for about 1 week.  Has had this going for a while and didn't think much on it and went to ED for back pain, diagnosed with UTI and told IUD may be malpositioned. Not wanting alternative contraceptive - not currently sexually active.   Last seen 03/2023 for Kyleen IUD insertion  Past Medical History:  Diagnosis Date   Anxiety    Depression    Gestational diabetes     Past Surgical History:  Procedure Laterality Date   NO PAST SURGERIES      The following portions of the patient's history were reviewed and updated as appropriate: allergies, current medications, past family history, past medical history, past social history, past surgical history and problem list.   Health Maintenance:   Last pap     Component Value Date/Time   DIAGPAP  03/24/2023 1158    - Negative for intraepithelial lesion or malignancy (NILM)   HPVHIGH Negative 03/24/2023 1158   ADEQPAP  03/24/2023 1158    Satisfactory for evaluation; transformation zone component PRESENT.    Last mammogram: N/A   Review of Systems:  Pertinent items are noted in HPI. Comprehensive review of systems was otherwise negative.   Objective:  Physical Exam BP 119/82   Pulse 88   Wt 208 lb 4.8 oz (94.5 kg)   LMP 04/12/2024 (Approximate)   BMI 38.10 kg/m    Physical Exam Vitals and nursing note reviewed. Exam conducted with a chaperone present.  Constitutional:      Appearance: Normal appearance.  HENT:     Head: Normocephalic and atraumatic.  Pulmonary:     Effort: Pulmonary effort is normal.      Breath sounds: Normal breath sounds.  Genitourinary:    General: Normal vulva.     Exam position: Lithotomy position.     Vagina: Normal.     Cervix: Normal.  Skin:    General: Skin is warm and dry.  Neurological:     General: No focal deficit present.     Mental Status: She is alert.  Psychiatric:        Mood and Affect: Mood normal.        Behavior: Behavior normal.        Thought Content: Thought content normal.        Judgment: Judgment normal.     IUD Removal  Patient identified, informed consent performed, consent signed.  Patient was in the dorsal lithotomy position, normal external genitalia was noted.  A speculum was placed in the patient's vagina, normal discharge was noted, no lesions. The cervix was visualized, no lesions, no abnormal discharge.  The strings of the IUD were visualized, grasped and pulled using ring forceps. The IUD was removed in its entirety. . Patient tolerated the procedure well.    Patient will use abstinence for contraception.      Assessment & Plan:   1. Encounter for IUD removal (Primary) Now s/p uncomplicated IUD removal. Noted menses should return to baseline.    Carter Quarry, MD [facility]  Healthcare, [facility]

## 2024-04-28 ENCOUNTER — Other Ambulatory Visit: Payer: Self-pay

## 2024-04-28 ENCOUNTER — Encounter (HOSPITAL_COMMUNITY): Payer: Self-pay

## 2024-04-28 ENCOUNTER — Emergency Department (HOSPITAL_COMMUNITY)
Admission: EM | Admit: 2024-04-28 | Discharge: 2024-04-28 | Discharge status: Against Medical Advice | Attending: Emergency Medicine | Admitting: Emergency Medicine

## 2024-04-28 DIAGNOSIS — M79604 Pain in right leg: Secondary | ICD-10-CM | POA: Insufficient documentation

## 2024-04-28 DIAGNOSIS — Z5321 Procedure and treatment not carried out due to patient leaving prior to being seen by health care provider: Secondary | ICD-10-CM | POA: Insufficient documentation

## 2024-04-28 DIAGNOSIS — R531 Weakness: Secondary | ICD-10-CM | POA: Diagnosis not present

## 2024-04-28 DIAGNOSIS — M79605 Pain in left leg: Secondary | ICD-10-CM | POA: Insufficient documentation

## 2024-04-28 LAB — COMPREHENSIVE METABOLIC PANEL WITH GFR
ALT: 15 U/L (ref 0–44)
AST: 26 U/L (ref 15–41)
Albumin: 3.8 g/dL (ref 3.5–5.0)
Alkaline Phosphatase: 50 U/L (ref 38–126)
Anion gap: 10 (ref 5–15)
BUN: 7 mg/dL (ref 6–20)
CO2: 20 mmol/L — ABNORMAL LOW (ref 22–32)
Calcium: 9.1 mg/dL (ref 8.9–10.3)
Chloride: 107 mmol/L (ref 98–111)
Creatinine, Ser: 0.8 mg/dL (ref 0.44–1.00)
GFR, Estimated: >60 mL/min (ref >60)
Glucose, Bld: 93 mg/dL (ref 70–99)
Potassium: 4.3 mmol/L (ref 3.5–5.1)
Sodium: 137 mmol/L (ref 135–145)
Total Bilirubin: 0.9 mg/dL (ref 0.0–1.2)
Total Protein: 7.2 g/dL (ref 6.5–8.1)

## 2024-04-28 LAB — URINALYSIS, ROUTINE W REFLEX MICROSCOPIC
Bilirubin Urine: NEGATIVE
Glucose, UA: NEGATIVE mg/dL
Hgb urine dipstick: NEGATIVE
Ketones, ur: 5 mg/dL — AB
Leukocytes,Ua: NEGATIVE
Nitrite: NEGATIVE
Protein, ur: NEGATIVE mg/dL
Specific Gravity, Urine: 1.014 (ref 1.005–1.030)
pH: 6 (ref 5.0–8.0)

## 2024-04-28 LAB — HCG, SERUM, QUALITATIVE: Preg, Serum: NEGATIVE

## 2024-04-28 LAB — CBC
HCT: 40.2 % (ref 36.0–46.0)
Hemoglobin: 13.8 g/dL (ref 12.0–15.0)
MCH: 31.4 pg (ref 26.0–34.0)
MCHC: 34.3 g/dL (ref 30.0–36.0)
MCV: 91.6 fL (ref 80.0–100.0)
Platelets: 259 K/uL (ref 150–400)
RBC: 4.39 MIL/uL (ref 3.87–5.11)
RDW: 12.6 % (ref 11.5–15.5)
WBC: 11 K/uL — ABNORMAL HIGH (ref 4.0–10.5)
nRBC: 0 % (ref 0.0–0.2)

## 2024-04-28 LAB — LIPASE, BLOOD: Lipase: 25 U/L (ref 11–51)

## 2024-04-28 LAB — CBG MONITORING, ED: Glucose-Capillary: 319 mg/dL — ABNORMAL HIGH (ref 70–99)

## 2024-04-28 NOTE — ED Provider Triage Note (Signed)
 Emergency Medicine Provider Triage Evaluation Note  Alexis Caldwell , a 38 y.o. female  was evaluated in triage.  Pt complains of longstanding pain and weakness to her lower extremities.  Has been seen by neurosurgeon without diagnosis..  Review of Systems  Positive: Back pain Negative: No bowel or bladder incontinence  Physical Exam  BP (!) 143/69 (BP Location: Right Arm)   Pulse 100   Temp 98.6 F (37 C)   Resp 16   Ht 1.575 m (5' 2)   Wt 93.9 kg   LMP 04/12/2024 (Approximate)   SpO2 100%   Breastfeeding No   BMI 37.86 kg/m    Medical Decision Making  Medically screening exam initiated at 12:46 PM.  Appropriate orders placed.  Alexis Caldwell was informed that the remainder of the evaluation will be completed by another provider, this initial triage assessment does not replace that evaluation, and the importance of remaining in the ED until their evaluation is complete.     Alexis Faden, MD 04/28/24 1246

## 2024-04-28 NOTE — ED Triage Notes (Signed)
 Pt here for right sided abd pain/groin pain for a year. Pain has been on and off. C/O constipation and dysuria. Denies CP and SHOB. Axox4. VSS. Pt got IUD out 2 weeks ago.

## 2024-04-28 NOTE — ED Notes (Signed)
 Called pt 3x for vitals, and received no response.

## 2024-08-09 ENCOUNTER — Ambulatory Visit: Admitting: Family Medicine

## 2024-08-09 DIAGNOSIS — I10 Essential (primary) hypertension: Secondary | ICD-10-CM | POA: Insufficient documentation

## 2024-08-09 DIAGNOSIS — F419 Anxiety disorder, unspecified: Secondary | ICD-10-CM | POA: Insufficient documentation

## 2024-08-09 DIAGNOSIS — Z6841 Body Mass Index (BMI) 40.0 and over, adult: Secondary | ICD-10-CM | POA: Insufficient documentation

## 2024-08-09 DIAGNOSIS — R269 Unspecified abnormalities of gait and mobility: Secondary | ICD-10-CM | POA: Insufficient documentation

## 2024-08-09 DIAGNOSIS — M5430 Sciatica, unspecified side: Secondary | ICD-10-CM | POA: Insufficient documentation

## 2024-08-09 DIAGNOSIS — E559 Vitamin D deficiency, unspecified: Secondary | ICD-10-CM | POA: Insufficient documentation

## 2024-08-14 ENCOUNTER — Encounter: Payer: Self-pay | Admitting: Neurology

## 2024-08-14 ENCOUNTER — Ambulatory Visit (INDEPENDENT_AMBULATORY_CARE_PROVIDER_SITE_OTHER): Admitting: Neurology

## 2024-08-14 VITALS — BP 93/70 | HR 80 | Ht 62.0 in | Wt 202.5 lb

## 2024-08-14 DIAGNOSIS — R269 Unspecified abnormalities of gait and mobility: Secondary | ICD-10-CM | POA: Diagnosis not present

## 2024-08-14 DIAGNOSIS — R32 Unspecified urinary incontinence: Secondary | ICD-10-CM | POA: Diagnosis not present

## 2024-08-14 NOTE — Progress Notes (Signed)
 "  Chief Complaint  Patient presents with   RM15/WEAKNESS    Pt is here Alone. Pt states that she fell today on her way to her appointment.       ASSESSMENT AND PLAN  Alexis Caldwell is a 38 y.o. female   Slow Worsening gait abnormality Urinary urgency, incontinence  Fixed contraction of bilateral Achille's tendon, hyperreflexia of bilateral upper extremity and patella, bilateral Hoffmann and Babinski signs  MRI of the brain, cervical thoracic spine with without contrast to rule out structural abnormality  DIAGNOSTIC DATA (LABS, IMAGING, TESTING) - I reviewed patient records, labs, notes, testing and imaging myself where available.   MEDICAL HISTORY:  Alexis Caldwell, is a 38 year old female alone at visit, seen in request by   her primary care PA Rosalea Knee for evaluation of gradual onset gait abnormality    History is obtained from the patient and review of electronic medical records. I personally reviewed pertinent available imaging films in PACS.   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 Transitional anatomy. For the purposes of this dictation, spinal numbering will remain consistent with that utilized on the prior lumbar spine radiographs of 03/30/2022. 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 Careful attention to spinal numbering is recommended prior to any intervention. 2. Developmentally narrow lower lumbar spinal canal (due to short pedicles). 3. Lumbar spondylosis as outlined within the body of the report. 4. At L5-S1, there is moderate disc degeneration with mild degenerative endplate edema. Multifactorial bilateral subarticular stenosis (with potential to affect either descending S1 nerve root). Moderate bilateral neural foraminal narrowing. 5. At L4-L5, there is multifactorial mild relative bilateral neural foraminal narrowing. 6. At L2-L3, a shallow broad-based left subarticular disc protrusion results in mild left subarticular stenosis. 7. Marrow edema within the posterior elements on the right at L5-S1 and within the L4 and L5 spinous processes, likely degenerative.  PHYSICAL EXAM:   Vitals:   08/14/24 1432  BP: 93/70  Pulse: 80  Weight: 202 lb 8 oz (91.9 kg)  Height: 5' 2 (1.575 m)   Body mass index is 37.04 kg/m.  PHYSICAL EXAMNIATION:  Gen: NAD, conversant, well nourised, well groomed                     Cardiovascular: Regular rate rhythm, no peripheral edema, warm, nontender. Eyes: Conjunctivae clear without exudates or hemorrhage Neck: Supple, no carotid  bruits. Pulmonary: Clear to auscultation bilaterally   NEUROLOGICAL EXAM:  MENTAL STATUS: Speech/cognition: Tearful during today's visit, awake, alert, oriented to history taking and casual conversation CRANIAL NERVES: CN II: Visual fields  are full to confrontation. Pupils are round equal and briskly reactive to light. CN III, IV, VI: extraocular movement are normal. No ptosis. CN V: Facial sensation is intact to light touch CN VII: Face is symmetric with normal eye closure  CN VIII: Hearing is normal to causal conversation. CN IX, X: Phonation is normal. CN XI: Head turning and shoulder shrug are intact  MOTOR: Upper extremity has normal proximal and distal strength, moderate to severe spasticity of bilateral lower extremity, mild bilateral hip flexion weakness, fixed contraction of bilateral Achilles tendon, maximum right ankle dorsiflexion 110, left 100 degree,  REFLEXES: Reflexes are hyperactive and symmetric at the biceps, 3/3 triceps, 3/3 knees, and ankles. Plantar responses are extensor bilaterally, bilateral Hoffmann sign  SENSORY: Intact to light touch, pinprick and vibratory sensation are intact in fingers and toes.  COORDINATION: There is no trunk or limb dysmetria noted.  GAIT/STANCE: Pushing up, stiff unsteady  REVIEW OF SYSTEMS:  Full 14 system review of systems performed and notable only for as above All other review of systems were negative.   ALLERGIES: Allergies[1]  HOME MEDICATIONS: Current Outpatient Medications  Medication Sig Dispense Refill   lidocaine  (LIDODERM ) 5 % Place 1 patch onto the skin daily. Remove & Discard patch within 12 hours or as directed by MD (Patient not taking: Reported on 08/14/2024) 30 patch 0   norethindrone  (ORTHO MICRONOR ) 0.35 MG tablet Take 1 tablet (0.35 mg total) by mouth daily. (Patient not taking: Reported on 08/14/2024) 28 tablet 11   oxyCODONE -acetaminophen  (PERCOCET/ROXICET) 5-325 MG tablet Take 1 tablet by mouth every 6 (six) hours as needed for severe pain (pain score 7-10). (Patient not taking: Reported on 08/14/2024) 12 tablet 0   No current facility-administered medications for this visit.    PAST MEDICAL HISTORY: Past Medical History:  Diagnosis  Date   Anxiety    Depression    Gestational diabetes     PAST SURGICAL HISTORY: Past Surgical History:  Procedure Laterality Date   NO PAST SURGERIES      FAMILY HISTORY: Family History  Problem Relation Age of Onset   Stroke Mother    Heart disease Mother    Hypertension Mother    Diabetes Mother    Diabetes Father    Heart disease Maternal Aunt    Obesity Maternal Grandfather    Heart disease Maternal Grandfather    Asthma Neg Hx     SOCIAL HISTORY: Social History   Socioeconomic History   Marital status: Single    Spouse name: Not on file   Number of children: Not on file   Years of education: Not on file   Highest education level: Not on file  Occupational History   Not on file  Tobacco Use   Smoking status: Former    Current packs/day: 0.00    Average packs/day: 0.5 packs/day    Types: Cigarettes    Quit date: 04/15/2022    Years since quitting: 2.3   Smokeless tobacco: Never  Vaping Use   Vaping status: Never Used  Substance and Sexual Activity   Alcohol use: Not Currently   Drug use: No   Sexual activity: Yes  Other Topics Concern   Not on file  Social History Narrative   Not on file   Social Drivers of Health  Tobacco Use: Medium Risk (08/14/2024)   Patient History    Smoking Tobacco Use: Former    Smokeless Tobacco Use: Never    Passive Exposure: Not on Actuary Strain: Not on file  Food Insecurity: No Food Insecurity (04/12/2024)   Epic    Worried About Programme Researcher, Broadcasting/film/video in the Last Year: Never true    Ran Out of Food in the Last Year: Never true  Transportation Needs: No Transportation Needs (04/12/2024)   Epic    Lack of Transportation (Medical): No    Lack of Transportation (Non-Medical): No  Physical Activity: Not on file  Stress: Not on file  Social Connections: Unknown (12/28/2021)   Received from [facility]   Social Network    Social Network: Not on file  Intimate Partner Violence: Not At Risk (12/24/2022)    Humiliation, Afraid, Rape, and Kick questionnaire    Fear of Current or Ex-Partner: No    Emotionally Abused: No    Physically Abused: No    Sexually Abused: No  Depression (PHQ2-9): Medium Risk (04/12/2024)   Depression (PHQ2-9)    PHQ-2 Score: 8  Alcohol Screen: Not on file  Housing: High Risk (12/24/2022)   Housing    Last Housing Risk Score: 3  Utilities: Not At Risk (12/24/2022)   AHC Utilities    Threatened with loss of utilities: No  Health Literacy: Not on file      Modena Callander, M.D. Ph.D.  [facility] [address], [address] [place], [place] [address] Ph: [phone] Fax: [phone]  CC:  Rosalea Rosina SAILOR, [place] [address] [place],  [place] [address]  Rosalea Rosina SAILOR, [place]       [1]  Allergies Allergen Reactions   Penicillins    "

## 2024-08-15 NOTE — Therapy (Unsigned)
 " OUTPATIENT PHYSICAL THERAPY THORACOLUMBAR EVALUATION   Patient Name: Alexis Caldwell MRN: [id number] DOB:June 04, 1986, 38 y.o., female Today's Date: 08/16/2024  END OF SESSION:  PT End of Session - 08/16/24 1524     Visit Number 1    Date for Recertification  10/14/24    Authorization Type MCD    PT Start Time 1530    PT Stop Time 1615    PT Time Calculation (min) 45 min    Activity Tolerance Patient tolerated treatment well    Behavior During Therapy [facility] for tasks assessed/performed          Past Medical History:  Diagnosis Date   Anxiety    Depression    Gestational diabetes    Past Surgical History:  Procedure Laterality Date   NO PAST SURGERIES     Patient Active Problem List   Diagnosis Date Noted   Urinary incontinence 08/14/2024   Anxiety 08/09/2024   Gait abnormality 08/09/2024   Body mass index (BMI) 40.0-44.9, adult (HCC) 08/09/2024   Essential hypertension 08/09/2024   Morbid obesity (HCC) 08/09/2024   Sciatica 08/09/2024   Vitamin D deficiency 08/09/2024   IUD (intrauterine device) in place 03/26/2023   Polyhydramnios affecting pregnancy in third trimester 12/22/2022   Gestational diabetes mellitus (GDM), antepartum 10/21/2022   Lumbosacral radiculopathy 04/23/2022   History of gestational diabetes 06/20/2014   Depression, unspecified 10/11/2013    PCP: Rosalea Rosina SAILOR, PA   REFERRING PROVIDER: Garnell Harlene CROME, FNP  REFERRING DIAG: [phone] (ICD-10-CM) - Other symptoms and signs involving the musculoskeletal system  Rationale for Evaluation and Treatment: Rehabilitation  THERAPY DIAG:  Radiculopathy, lumbosacral region - Plan: PT plan of care cert/re-cert  Difficulty in walking, not elsewhere classified  Muscle weakness (generalized)  ONSET DATE: chronic  SUBJECTIVE:                                                                                                                                                                                            SUBJECTIVE STATEMENT: Patient arrives to [facility] with DX of low back pain.    PERTINENT HISTORY:  faxed 07/28/24 [place] lumbar radiculopathy  1-2x/week for 4-6 weeks, do not schedule more than 6 visits, return to [facility] for assessment. If the patient unable to tolerate, please send back to [facility] ASAP. Please fax physical therapy note after each visit   PAIN:  Are you having pain? Yes: NPRS scale: 8 Pain location: low back Pain description: sore Aggravating factors: activity Relieving factors: undetermined  PRECAUTIONS: Fall  RED FLAGS: Bowel or bladder incontinence: Yes: ongoing for 1 year +, MD aware   WEIGHT BEARING  RESTRICTIONS: No  FALLS:  Has patient fallen in last 6 months? Yes. Number of falls 6+  OCCUPATION: not working  PLOF: Independent  PATIENT GOALS: To manage my back pain  NEXT MD VISIT: TBD  OBJECTIVE:  Note: Objective measures were completed at Evaluation unless otherwise noted.  DIAGNOSTIC FINDINGS:  IMPRESSION: 1. Transitional anatomy. For the purposes of this dictation, spinal numbering will remain consistent with that utilized on the prior lumbar spine radiographs of 03/30/2022. 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 Careful attention to spinal numbering is recommended prior to any intervention. 2. Developmentally narrow lower lumbar spinal canal (due to short pedicles). 3. Lumbar spondylosis as outlined within the body of the report. 4. At L5-S1, there is moderate disc degeneration with mild degenerative endplate edema. Multifactorial bilateral subarticular stenosis (with potential to affect either descending S1 nerve root). Moderate bilateral neural foraminal narrowing. 5. At L4-L5, there is multifactorial mild relative bilateral neural foraminal narrowing. 6. At L2-L3, a shallow broad-based left subarticular disc  protrusion results in mild left subarticular stenosis. 7. Marrow edema within the posterior elements on the right at L5-S1 and within the L4 and L5 spinous processes, likely degenerative.     Electronically Signed   By: Rockey Childs D.O.   On: 04/09/2024 18:57  PATIENT SURVEYS:  Oswestry Low Back Pain Disability Questionnaire: 34/50  Interpretation of scores: Score Category Description  0-20% Minimal Disability The patient can cope with most living activities. Usually no treatment is indicated apart from advice on lifting, sitting and exercise  21-40% Moderate Disability The patient experiences more pain and difficulty with sitting, lifting and standing. Travel and social life are more difficult and they may be disabled from work. Personal care, sexual activity and sleeping are not grossly affected, and the patient can usually be managed by conservative means  41-60% Severe Disability Pain remains the main problem in this group, but activities of daily living are affected. These patients require a detailed investigation  61-80% Crippled Back pain impinges on all aspects of the patients life. Positive intervention is required  81-100% Bed-bound These patients are either bed-bound or exaggerating their symptoms  Reference: Adelle SPEAK, Pynsent PB. The Oswestry Disability Index. Spine 2000 Nov 15;25(22):2940-52; discussion 55.  Minimum detectable change (90% confidence): 10% points    MUSCLE LENGTH: Hamstrings: UTA due to tone Debby test: UTA due to tone  POSTURE: Increased extensor tone in spine and BLEs  PALPATION: deferred  LUMBAR ROM: UTA due to fall risk  AROM eval  Flexion   Extension   Right lateral flexion   Left lateral flexion   Right rotation   Left rotation    (Blank rows = not tested)  LOWER EXTREMITY ROM:   WFL for transfers and short distance gait  Active  Right eval Left eval  Hip flexion    Hip extension    Hip abduction    Hip adduction    Hip  internal rotation    Hip external rotation    Knee flexion    Knee extension    Ankle dorsiflexion    Ankle plantarflexion    Ankle inversion    Ankle eversion     (Blank rows = not tested)  LOWER EXTREMITY MMT:  See 30s chair stand test  MMT Right eval Left eval  Hip flexion    Hip extension    Hip abduction    Hip adduction  Hip internal rotation    Hip external rotation    Knee flexion    Knee extension    Ankle dorsiflexion    Ankle plantarflexion    Ankle inversion    Ankle eversion     (Blank rows = not tested)  LUMBAR SPECIAL TESTS:  Straight leg raise test: limited by hamstring tone and Slump test: Negative  FUNCTIONAL TESTS:  30 seconds chair stand test 0  GAIT: Distance walked: 61ft x2 Assistive device utilized: Walker - 2 wheeled Level of assistance: Complete Independence   TREATMENT:                                                                                                                          [facility] Adult PT Treatment:                                                DATE: 08/16/24 Eval and HEP Self Care: Additional minutes spent for educating on updated Therapeutic Home Exercise Program as well as comparing current status to condition at start of symptoms. This included exercises focusing on stretching, strengthening, with focus on eccentric aspects. Long term goals include an improvement in range of motion, strength, endurance as well as avoiding reinjury. Patient's frequency would include in 1-2 times a day, 3-5 times a week for a duration of 6-12 weeks. Proper technique shown and discussed handout in great detail. All questions were discussed and addressed.       PATIENT EDUCATION:  Education details: Discussed eval findings, rehab rationale and POC and patient is in agreement  Person educated: Patient Education method: Explanation and Handouts Education comprehension: verbalized understanding and needs further education  HOME EXERCISE  PROGRAM: TBD  ASSESSMENT:  CLINICAL IMPRESSION: Patient is a 38 y.o. female who was seen today for physical therapy evaluation and treatment for low back pain and radiculopathy. Scope of assessment limited by fall risk and associated increase in BLE tone.  30s chair stand test finds weakness, balance issues and lack of coordination.  Recommend patient attend aquatic PT until imaging scans identify cause of symptoms/pathology.   OBJECTIVE IMPAIRMENTS: Abnormal gait, decreased activity tolerance, decreased balance, decreased knowledge of condition, decreased mobility, difficulty walking, decreased ROM, decreased strength, impaired perceived functional ability, increased muscle spasms, postural dysfunction, and pain.   ACTIVITY LIMITATIONS: carrying, lifting, bending, sitting, standing, squatting, sleeping, stairs, transfers, and bed mobility  PERSONAL FACTORS: Age, Fitness, Past/current experiences, and Time since onset of injury/illness/exacerbation are also affecting patient's functional outcome.   REHAB POTENTIAL: Fair based on chronicity and ongoing neurological assessments and pending imaging studies  CLINICAL DECISION MAKING: Evolving/moderate complexity  EVALUATION COMPLEXITY: Moderate   GOALS: Goals reviewed with patient? No  SHORT TERM GOALS: Target date: 10/11/2024   Patient to demonstrate independence in HEP  Baseline:TBD Goal status: INITIAL  2.  268ft  ambulation with LRAD  Baseline: 69ft with RW Goal status: INITIAL  3.  Patient will increase 30s chair stand reps from 1 to 6 with/without arms to demonstrate and improved functional ability with less pain/difficulty as well as reduce fall risk.  Baseline: 1 Goal status: INITIAL  4.  Patient will acknowledge 4/10 pain at least once during episode of care   Baseline: 7/10 Goal status: INITIAL  5.  Patient will score at least 22/50 on ODI to signify clinically meaningful improvement in functional abilities.   Baseline:  34/50 Goal status: INITIAL    PLAN:  PT FREQUENCY: 2x/week  PT DURATION: 6 weeks  PLANNED INTERVENTIONS: 97110-Therapeutic exercises, 97530- Therapeutic activity, [id number]- Neuromuscular re-education, 97535- Self Care, [id number]- Manual therapy, [phone]- Gait training, Patient/Family education, Balance training, and Stair training.  PLAN FOR NEXT SESSION: HEP review and update, manual techniques as appropriate, aerobic tasks, ROM and flexibility activities, strengthening and PREs, TPDN, gait and balance training,aquatic therapy, modalities for pain and NMRE    For all possible CPT codes, reference the Planned Interventions line above.     Check all conditions that are expected to impact treatment: {Conditions expected to impact treatment:Contractures, spasticity or fracture relevant to requested treatment   If treatment provided at initial evaluation, no treatment charged due to lack of authorization.       Sussie Minor M Fox Salminen, PT 08/16/2024, 4:44 PM  "

## 2024-08-16 ENCOUNTER — Ambulatory Visit: Attending: Nurse Practitioner

## 2024-08-16 ENCOUNTER — Other Ambulatory Visit: Payer: Self-pay

## 2024-08-16 DIAGNOSIS — M5417 Radiculopathy, lumbosacral region: Secondary | ICD-10-CM | POA: Insufficient documentation

## 2024-08-16 DIAGNOSIS — R262 Difficulty in walking, not elsewhere classified: Secondary | ICD-10-CM | POA: Diagnosis present

## 2024-08-16 DIAGNOSIS — M6281 Muscle weakness (generalized): Secondary | ICD-10-CM | POA: Insufficient documentation

## 2024-08-21 ENCOUNTER — Telehealth: Payer: Self-pay | Admitting: Neurology

## 2024-08-21 NOTE — Telephone Encounter (Signed)
 Alexis Caldwell: [id number] exp. 08/15/24-10/14/24 scheduled at [facility].

## 2024-08-23 ENCOUNTER — Ambulatory Visit: Payer: Self-pay

## 2024-08-25 ENCOUNTER — Ambulatory Visit: Payer: Self-pay

## 2024-08-28 ENCOUNTER — Ambulatory Visit: Payer: Self-pay

## 2024-08-30 ENCOUNTER — Other Ambulatory Visit

## 2024-08-31 ENCOUNTER — Ambulatory Visit: Payer: Self-pay

## 2024-09-01 ENCOUNTER — Ambulatory Visit
Admission: RE | Admit: 2024-09-01 | Discharge: 2024-09-01 | Disposition: A | Discharge status: Home / Self Care | Source: Ambulatory Visit | Attending: Neurology | Admitting: Neurology

## 2024-09-01 ENCOUNTER — Ambulatory Visit
Admission: RE | Admit: 2024-09-01 | Discharge: 2024-09-01 | Disposition: A | Discharge status: Home / Self Care | Payer: Self-pay | Source: Ambulatory Visit | Attending: Neurology | Admitting: Neurology

## 2024-09-01 DIAGNOSIS — R269 Unspecified abnormalities of gait and mobility: Secondary | ICD-10-CM

## 2024-09-01 DIAGNOSIS — R32 Unspecified urinary incontinence: Secondary | ICD-10-CM

## 2024-09-01 MED ORDER — GADOPICLENOL 0.5 MMOL/ML IV SOLN
9.0000 mL | Freq: Once | INTRAVENOUS | Status: AC | PRN
  Administered 2024-09-01: 9 mL via INTRAVENOUS

## 2024-09-04 ENCOUNTER — Ambulatory Visit: Payer: Self-pay

## 2024-09-07 ENCOUNTER — Other Ambulatory Visit

## 2024-09-07 ENCOUNTER — Ambulatory Visit: Payer: Self-pay | Admitting: Neurology

## 2024-09-08 ENCOUNTER — Ambulatory Visit: Payer: Self-pay

## 2024-09-12 ENCOUNTER — Ambulatory Visit: Payer: Self-pay

## 2024-09-13 ENCOUNTER — Ambulatory Visit
Admission: RE | Admit: 2024-09-13 | Discharge: 2024-09-13 | Disposition: A | Payer: Self-pay | Source: Ambulatory Visit | Attending: Neurology | Admitting: Neurology

## 2024-09-13 DIAGNOSIS — R269 Unspecified abnormalities of gait and mobility: Secondary | ICD-10-CM

## 2024-09-13 DIAGNOSIS — R32 Unspecified urinary incontinence: Secondary | ICD-10-CM

## 2024-09-13 MED ORDER — GADOPICLENOL 0.5 MMOL/ML IV SOLN
9.0000 mL | Freq: Once | INTRAVENOUS | Status: AC | PRN
  Administered 2024-09-13: 9 mL via INTRAVENOUS

## 2024-09-15 ENCOUNTER — Ambulatory Visit: Payer: Self-pay

## 2024-09-19 ENCOUNTER — Telehealth: Payer: Self-pay | Admitting: Neurology

## 2024-09-19 DIAGNOSIS — M4802 Spinal stenosis, cervical region: Secondary | ICD-10-CM | POA: Insufficient documentation

## 2024-09-20 NOTE — Telephone Encounter (Signed)
 Pt scheduled

## 2024-09-21 ENCOUNTER — Encounter: Payer: Self-pay | Admitting: Neurosurgery

## 2024-09-21 ENCOUNTER — Ambulatory Visit: Payer: Self-pay | Admitting: Neurosurgery

## 2024-09-21 VITALS — BP 122/84 | Ht 62.0 in | Wt 194.4 lb

## 2024-09-21 DIAGNOSIS — R252 Cramp and spasm: Secondary | ICD-10-CM | POA: Insufficient documentation

## 2024-09-21 DIAGNOSIS — R269 Unspecified abnormalities of gait and mobility: Secondary | ICD-10-CM | POA: Insufficient documentation

## 2024-09-21 DIAGNOSIS — M4802 Spinal stenosis, cervical region: Secondary | ICD-10-CM

## 2024-09-21 DIAGNOSIS — R159 Full incontinence of feces: Secondary | ICD-10-CM | POA: Insufficient documentation

## 2024-09-21 DIAGNOSIS — G9589 Other specified diseases of spinal cord: Secondary | ICD-10-CM | POA: Insufficient documentation

## 2024-09-21 DIAGNOSIS — G549 Nerve root and plexus disorder, unspecified: Secondary | ICD-10-CM | POA: Insufficient documentation

## 2024-09-21 NOTE — Progress Notes (Addendum)
 Consulting Department:  Neurology  Primary Physician:  Rosalea Rosina SAIL[place], [place]  Chief Complaint: Cervical myelopathy  History of Present Illness: 09/21/2024 Alexis Caldwell is a 39 y.o. female who presents with the chief complaint of severe cervical myeloradiculopathy.  Since approximately 2022 she has noticed steady but progressive loss of function in her upper and lower extremities.  She has noticed that this has been rapidly worsening.  She was recently seen by neurology who was concerned for cervical myelopathy and ordered cross-sectional imaging which showed severe cervical stenosis and cervical myelomalacia.  Patient states that her numbness has gotten up to her xiphoid process.  She has had trouble with bowel and bladder incontinence.  Has had trouble with ambulation.  Feels that her legs have tightened severely and she has trouble controlling them.  Also has difficulty controlling her arms.  She has multiple falls.  She has had previous physical therapy for rehabilitative purposes but has not had significant improvement instead continues to worsen.  The symptoms are causing a significant impact on the patient's life.   Review of Systems:  A 10 point review of systems is negative, except for the pertinent positives and negatives detailed in the HPI.  Past Medical History: Past Medical History:  Diagnosis Date   Anxiety    Depression    Gestational diabetes     Past Surgical History: Past Surgical History:  Procedure Laterality Date   NO PAST SURGERIES      Allergies: Allergies as of 09/21/2024 - Review Complete 09/21/2024  Allergen Reaction Noted   Penicillins  12/22/2018    Medications: Current Medications[1]   Social History: Social History[2]  Family Medical History: Family History  Problem Relation Age of Onset   Stroke Mother    Heart disease Mother    Hypertension Mother    Diabetes Mother    Diabetes Father    Heart disease Maternal Aunt    Obesity  Maternal Grandfather    Heart disease Maternal Grandfather    Asthma Neg Hx     Physical Examination: Vitals:   09/21/24 1536  BP: 122/84     General: Patient is well developed, well nourished, calm, collected, and in no apparent distress.  NEUROLOGICAL:  General: In no acute distress.   Awake, alert, oriented to person, place, and time.  Pupils equal round and reactive to light.  Facial tone is symmetric.  Tongue protrusion is midline.  There is no pronator drift.  Strength: Motor exam is difficult to give true formal grading given the presence of significant spasticity development.  She has significant intrinsic hand weakness approximately 4- out of 5 bilaterally.  Otherwise on confrontational exam when able to do so she is at least 4+ with the exception of her triceps which are 4-.  She has decreased sensation to touch to her xiphoid process bilaterally.  She has hyperreflexia noted at least 3+ throughout.  Bilateral Hoffmann sign.  Significant spasticity in the bilateral lower extremities makes reflex testing difficult especially for pathologic reflexes.  She currently walks with a walker  Imaging: NEUROIMAGING REPORT     STUDY DATE: 09/13/24 PATIENT NAME: Alexis Caldwell DOB: Oct 30, 1985 MRN: [id number]   ORDERING CLINICIAN: Onita Duos, MD  CLINICAL HISTORY: 39 y.o. year old female with: 1. Gait abnormality   2. Urinary incontinence, unspecified type       EXAM: MR CERVICAL SPINE W WO CONTRAST  TECHNIQUE: MRI of the cervical spine was obtained utilizing multiplanar, multiecho pulse sequences. CONTRAST: Vueway  9ml  COMPARISON: none   IMAGING SITE: [facility]  [facility] [address] [place] [address]     FINDINGS:    Degenerative spondylosis and disc bulging with severe spinal cord compression at C4-5.   On sagittal views the vertebral bodies have normal height and alignment.  The spinal cord is notable for severe  spinal cord compression at C4-5 with T2 STIR hyperintense signal. The posterior fossa, pituitary gland and paraspinal soft tissues are unremarkable.     On axial views:   C2-3 no spinal stenosis or foraminal narrowing C3-4 disc bulging with flattening of spinal cord, increased T2 signal within the spinal cord, and moderate to severe spinal stenosis C4-5 disc bulging, facet hypertrophy with severe spinal cord compression, increased T2 signal within the spinal cord, and severe bilateral foraminal stenosis C5-6 posterior disc protrusion with deformation of the ventral spinal cord resulting in moderate spinal stenosis and mild left foraminal narrowing C6-7 no spinal stenosis or foraminal narrowing C7-T1 uncovertebral joint and facet hypertrophy with mild right and moderate left foraminal stenosis T1-2 no spinal stenosis or foraminal narrowing   Limited views of the soft tissues of the head and neck are unremarkable.   No abnormal enhancing lesions on postcontrast views.     IMPRESSION:    MRI cervical spine with and without contrast demonstrating: - At C4-5 disc bulging, facet hypertrophy with severe spinal cord compression, increased T2 signal within the spinal cord, and severe bilateral foraminal stenosis. - At C3-4 disc bulging with flattening of spinal cord, increased T2 signal within the spinal cord, and moderate to severe spinal stenosis. - At C5-6 posterior disc protrusion with deformation of the ventral spinal cord resulting in moderate spinal stenosis and mild left foraminal narrowing.       INTERPRETING PHYSICIAN:  EDUARD FABIENE HANLON, MD Certified in Neurology, Neurophysiology and Neuroimaging   [facility] [address], [address] [place], [place] [address] [phone]   I have personally reviewed the images and agree with the above interpretation.  Labs:    Latest Ref Rng & Units 04/28/2024   12:00 PM 04/09/2024    4:16 PM 12/24/2022    2:36 PM  CBC   WBC 4.0 - 10.5 K/uL 11.0  11.0  15.2   Hemoglobin 12.0 - 15.0 g/dL 86.1  86.3  87.5   Hematocrit 36.0 - 46.0 % 40.2  40.2  33.9   Platelets 150 - 400 K/uL 259  280  327       Latest Ref Rng & Units 04/28/2024   12:00 PM 04/09/2024    4:16 PM 02/07/2022    3:38 PM  BMP  Glucose 70 - 99 mg/dL 93  898  896   BUN 6 - 20 mg/dL 7  10  12    Creatinine 0.44 - 1.00 mg/dL 9.19  9.14  9.25   Sodium 135 - 145 mmol/L 137  136  137   Potassium 3.5 - 5.1 mmol/L 4.3  3.9  3.9   Chloride 98 - 111 mmol/L 107  105  107   CO2 22 - 32 mmol/L 20  21  22    Calcium 8.9 - 10.3 mg/dL 9.1  9.1  9.7      Assessment and Plan: Ms. Trouten is a pleasant 39 y.o. female with rapidly progressive cervical myeloradiculopathy.  She started to notice some difficulties  as remotely as 2022 but has had progressive loss of function in her bilateral upper and lower extremities with  progressive loss of function and her ability to walk that has been rapidly worsening over the past few months.  She has had a recent evaluation with neurology who was concerned for cervical myeloradiculopathy and have ordered cross-sectional imaging of her cervical spine which demonstrated severe cervical stenosis from C3-C6 with severe myelomalacia and deformity of the cervical cord secondary to cervical disc herniations.  In order to adequately prepare for surgical decompression she requires a cervical CT scan without contrast to evaluate for calcified disc herniations as this would significantly impact our surgical approach.  We are requesting this to be done urgently so we can move forward with surgical planning given her rapid decompensation.  Will plan for flexion-extension cervical x-rays to evaluate for any spinal instability to help in our surgical planning.   Once she has had this cervical CT scan and her cervical flexion-extension x-rays we will plan on booking her for a surgical decompression.  This potentially will be a three-level  anterior cervical discectomy and fusion versus a posterior cervical decompression and fusion based off of the results of the CT scan.  She is not currently on any blood thinners.  She does have significant disability noted on her physical examination.  We had a long discussion about the risks and benefits of surgery.  Given her severe myelomalacia we did discuss that some patients will continue to worsen even after decompression.  She does have a severe amount of spasticity present and many patients with this will often notice that there walking will decline after surgical decompression as the spasticity will start to improve but in the absence of these spastic muscle contractions patients will often feel weaker.  We also discussed that we cannot reverse the myelomalacia and the goal would be to stop her from continuing to decline although she will need a significant extended amount of physical therapy and will likely involve our spinal cord rehabilitation team to aid us  in this recovery.  We did discuss with her the importance of tobacco cessation as this severely decreases the likelihood of success with her surgical decompression and fusion.  Penne MICAEL Sharps, MD/MSCR Dept. of Neurosurgery       [1] No current outpatient medications on file. [2]  Social History Tobacco Use   Smoking status: Some Days    Current packs/day: 0.00    Average packs/day: 0.5 packs/day    Types: Cigarettes    Last attempt to quit: 04/15/2022    Years since quitting: 2.4   Smokeless tobacco: Never  Vaping Use   Vaping status: Never Used  Substance Use Topics   Alcohol use: Not Currently   Drug use: No

## 2024-09-22 ENCOUNTER — Ambulatory Visit: Payer: Self-pay | Admitting: Neurosurgery

## 2024-10-06 ENCOUNTER — Ambulatory Visit: Payer: Self-pay | Admitting: Internal Medicine
